# Patient Record
Sex: Male | Born: 1945 | Race: White | Hispanic: No | Marital: Married | Smoking: Former smoker
Health system: Southern US, Community
[De-identification: ages and names within clinical notes are randomized; demographics above are authoritative.]

## PROBLEM LIST (undated history)

## (undated) DIAGNOSIS — R2689 Other abnormalities of gait and mobility: Secondary | ICD-10-CM

## (undated) DIAGNOSIS — A6920 Lyme disease, unspecified: Secondary | ICD-10-CM

## (undated) DIAGNOSIS — F32A Depression, unspecified: Secondary | ICD-10-CM

## (undated) DIAGNOSIS — F329 Major depressive disorder, single episode, unspecified: Secondary | ICD-10-CM

## (undated) DIAGNOSIS — I10 Essential (primary) hypertension: Secondary | ICD-10-CM

## (undated) DIAGNOSIS — D649 Anemia, unspecified: Secondary | ICD-10-CM

## (undated) DIAGNOSIS — R251 Tremor, unspecified: Secondary | ICD-10-CM

## (undated) DIAGNOSIS — R0600 Dyspnea, unspecified: Secondary | ICD-10-CM

## (undated) DIAGNOSIS — G6181 Chronic inflammatory demyelinating polyneuritis: Secondary | ICD-10-CM

## (undated) DIAGNOSIS — M1990 Unspecified osteoarthritis, unspecified site: Secondary | ICD-10-CM

## (undated) DIAGNOSIS — G4730 Sleep apnea, unspecified: Secondary | ICD-10-CM

## (undated) DIAGNOSIS — E039 Hypothyroidism, unspecified: Secondary | ICD-10-CM

## (undated) DIAGNOSIS — K219 Gastro-esophageal reflux disease without esophagitis: Secondary | ICD-10-CM

## (undated) DIAGNOSIS — J8410 Pulmonary fibrosis, unspecified: Secondary | ICD-10-CM

## (undated) DIAGNOSIS — E119 Type 2 diabetes mellitus without complications: Secondary | ICD-10-CM

## (undated) HISTORY — PX: MUSCLE BIOPSY: SHX716

## (undated) HISTORY — PX: BACK SURGERY: SHX140

## (undated) HISTORY — PX: TOTAL KNEE ARTHROPLASTY: SHX125

## (undated) HISTORY — PX: OTHER SURGICAL HISTORY: SHX169

## (undated) HISTORY — PX: APPENDECTOMY: SHX54

## (undated) HISTORY — PX: CATARACT EXTRACTION W/ INTRAOCULAR LENS IMPLANT: SHX1309

## (undated) HISTORY — PX: HIP SURGERY: SHX245

## (undated) HISTORY — PX: COLONOSCOPY: SHX174

---

## 2009-03-10 ENCOUNTER — Inpatient Hospital Stay (HOSPITAL_COMMUNITY): Admission: RE | Admit: 2009-03-10 | Discharge: 2009-03-12 | Payer: Self-pay | Admitting: Orthopedic Surgery

## 2010-09-09 LAB — BASIC METABOLIC PANEL
BUN: 14 mg/dL (ref 6–23)
BUN: 7 mg/dL (ref 6–23)
Calcium: 8.8 mg/dL (ref 8.4–10.5)
Chloride: 101 mEq/L (ref 96–112)
Creatinine, Ser: 1.02 mg/dL (ref 0.4–1.5)
Creatinine, Ser: 1.14 mg/dL (ref 0.4–1.5)
GFR calc Af Amer: >60 mL/min (ref >60)
GFR calc non Af Amer: >60 mL/min (ref >60)
GFR calc non Af Amer: >60 mL/min (ref >60)
Glucose, Bld: 122 mg/dL — ABNORMAL HIGH (ref 70–99)

## 2010-09-09 LAB — CBC
HCT: 33.1 % — ABNORMAL LOW (ref 39.0–52.0)
MCV: 90 fL (ref 78.0–100.0)
Platelets: 150 10*3/uL (ref 150–400)
Platelets: 152 10*3/uL (ref 150–400)
RBC: 3.57 MIL/uL — ABNORMAL LOW (ref 4.22–5.81)
RDW: 13.1 % (ref 11.5–15.5)
WBC: 5.7 10*3/uL (ref 4.0–10.5)
WBC: 6.2 10*3/uL (ref 4.0–10.5)

## 2010-09-09 LAB — GLUCOSE, CAPILLARY
Glucose-Capillary: 120 mg/dL — ABNORMAL HIGH (ref 70–99)
Glucose-Capillary: 125 mg/dL — ABNORMAL HIGH (ref 70–99)
Glucose-Capillary: 126 mg/dL — ABNORMAL HIGH (ref 70–99)
Glucose-Capillary: 128 mg/dL — ABNORMAL HIGH (ref 70–99)
Glucose-Capillary: 131 mg/dL — ABNORMAL HIGH (ref 70–99)
Glucose-Capillary: 139 mg/dL — ABNORMAL HIGH (ref 70–99)

## 2010-09-09 LAB — TYPE AND SCREEN: ABO/RH(D): A POS

## 2010-09-09 LAB — ABO/RH: ABO/RH(D): A POS

## 2010-09-10 LAB — URINALYSIS, ROUTINE W REFLEX MICROSCOPIC
Bilirubin Urine: NEGATIVE
Glucose, UA: NEGATIVE mg/dL
Hgb urine dipstick: NEGATIVE
Specific Gravity, Urine: 1.014 (ref 1.005–1.030)

## 2010-09-10 LAB — APTT: aPTT: 38 seconds — ABNORMAL HIGH (ref 24–37)

## 2010-09-10 LAB — DIFFERENTIAL
Basophils Absolute: 0 10*3/uL (ref 0.0–0.1)
Basophils Relative: 0 % (ref 0–1)
Lymphocytes Relative: 18 % (ref 12–46)
Monocytes Absolute: 0.7 10*3/uL (ref 0.1–1.0)
Monocytes Relative: 10 % (ref 3–12)
Neutro Abs: 5.4 10*3/uL (ref 1.7–7.7)
Neutrophils Relative %: 69 % (ref 43–77)

## 2010-09-10 LAB — COMPREHENSIVE METABOLIC PANEL
AST: 29 U/L (ref 0–37)
Albumin: 4 g/dL (ref 3.5–5.2)
BUN: 15 mg/dL (ref 6–23)
Creatinine, Ser: 1.03 mg/dL (ref 0.4–1.5)
GFR calc Af Amer: >60 mL/min (ref >60)
Potassium: 3.8 mEq/L (ref 3.5–5.1)
Total Protein: 7.4 g/dL (ref 6.0–8.3)

## 2010-09-10 LAB — PROTIME-INR: INR: 1 (ref 0.00–1.49)

## 2010-09-10 LAB — CBC
Hemoglobin: 14.9 g/dL (ref 13.0–17.0)
MCHC: 34.3 g/dL (ref 30.0–36.0)
RBC: 4.79 MIL/uL (ref 4.22–5.81)

## 2016-02-25 NOTE — H&P (Signed)
  Louis Simpson is an 70 y.o. male.    Chief Complaint: left shoulder pain  HPI: Pt is a 70 y.o. male complaining of left shoulder pain for multiple years. Pain had continually increased since the beginning. X-rays in the clinic show end-stage arthritic changes of the left shoulder. Pt has tried various conservative treatments which have failed to alleviate their symptoms, including injections and therapy. Various options are discussed with the patient. Risks, benefits and expectations were discussed with the patient. Patient understand the risks, benefits and expectations and wishes to proceed with surgery.   PCP:  No primary care provider on file.  D/C Plans: Home  PMH: No past medical history on file.  PSH: No past surgical history on file.  Social History:  has no tobacco, alcohol, and drug history on file.  Allergies:  Allergies not on file  Medications: No current facility-administered medications for this encounter.    No current outpatient prescriptions on file.    No results found for this or any previous visit (from the past 48 hour(s)). No results found.  ROS: Pain with rom of the left upper extremity  Physical Exam:  Alert and oriented 70 y.o. male in no acute distress Cranial nerves 2-12 intact Cervical spine: full rom with no tenderness, nv intact distally Chest: active breath sounds bilaterally, no wheeze rhonchi or rales Heart: regular rate and rhythm, no murmur Abd: non tender non distended with active bowel sounds Hip is stable with rom  Left shoulder with mild crepitus with rom nv intact distally No rashes Strength 4.5/5 with ER and IR but cuff intact   Assessment/Plan Assessment: left shoulder end stage osteoarthritis  Plan: Patient will undergo a left total shoulder arthroplasty by [facility]. Risks benefits and expectations were discussed with the patient. Patient understand risks, benefits and expectations and wishes to  proceed.

## 2016-02-26 NOTE — Pre-Procedure Instructions (Signed)
Louis Simpson  02/26/2016     No Pharmacies Listed   Your procedure is scheduled on Friday September 29.  Report to [facility] Admitting at 5:30 A.M.  Call this number if you have problems the morning of surgery:  [phone]   Remember:  Do not eat food or drink liquids after midnight.  Take these medicines the morning of surgery with A SIP OF WATER: bupropion (Wellbutrin), Flexeril, duloxetine (Cymbalta), levothyroxine (Synthroid), tramadol (ultram) if needed  7 days prior to surgery STOP taking any Aspirin, Aleve, Naproxen, Ibuprofen, Motrin, Advil, Goody's, BC's, all herbal medications, fish oil, and all vitamins   WHAT DO I DO ABOUT MY DIABETES MEDICATION?   Marland Kitchen Do not take oral diabetes medicines (pills) the morning of surgery. DO NOT TAKE glimepiride (Amaryl) the day of surgery.       How to Manage Your Diabetes Before and After Surgery  Why is it important to control my blood sugar before and after surgery? . Improving blood sugar levels before and after surgery helps healing and can limit problems. . A way of improving blood sugar control is eating a healthy diet by: o  Eating less sugar and carbohydrates o  Increasing activity/exercise o  Talking with your doctor about reaching your blood sugar goals . High blood sugars (greater than 180 mg/dL) can raise your risk of infections and slow your recovery, so you will need to focus on controlling your diabetes during the weeks before surgery. . Make sure that the doctor who takes care of your diabetes knows about your planned surgery including the date and location.  How do I manage my blood sugar before surgery? . Check your blood sugar at least 4 times a day, starting 2 days before surgery, to make sure that the level is not too high or low. o Check your blood sugar the morning of your surgery when you wake up and every 2 hours until you get to the Short Stay unit. . If your blood sugar is less than  70 mg/dL, you will need to treat for low blood sugar: o Do not take insulin. o Treat a low blood sugar (less than 70 mg/dL) with  cup of clear juice (cranberry or apple), 4 glucose tablets, OR glucose gel. o Recheck blood sugar in 15 minutes after treatment (to make sure it is greater than 70 mg/dL). If your blood sugar is not greater than 70 mg/dL on recheck, call [phone] for further instructions. . Report your blood sugar to the short stay nurse when you get to Short Stay.  . If you are admitted to the hospital after surgery: o Your blood sugar will be checked by the staff and you will probably be given insulin after surgery (instead of oral diabetes medicines) to make sure you have good blood sugar levels. o The goal for blood sugar control after surgery is 80-180 mg/dL.                Do not wear jewelry, make-up or nail polish.  Do not wear lotions, powders, or perfumes, or deoderant.  Do not shave 48 hours prior to surgery.  Men may shave face and neck.  Do not bring valuables to the hospital.  [facility] is not responsible for any belongings or valuables.  Contacts, dentures or bridgework may not be worn into surgery.  Leave your suitcase in the car.  After surgery it may be brought to your room.  For patients  admitted to the hospital, discharge time will be determined by your treatment team.  Patients discharged the day of surgery will not be allowed to drive home.   Special instructions:    [facility]- Preparing For Surgery  Before surgery, you can play an important role. Because skin is not sterile, your skin needs to be as free of germs as possible. You can reduce the number of germs on your skin by washing with CHG (chlorahexidine gluconate) Soap before surgery.  CHG is an antiseptic cleaner which kills germs and bonds with the skin to continue killing germs even after washing.  Please do not use if you have an allergy to CHG or antibacterial soaps. If your  skin becomes reddened/irritated stop using the CHG.  Do not shave (including legs and underarms) for at least 48 hours prior to first CHG shower. It is OK to shave your face.  Please follow these instructions carefully.   1. Shower the NIGHT BEFORE SURGERY and the MORNING OF SURGERY with CHG.   2. If you chose to wash your hair, wash your hair first as usual with your normal shampoo.  3. After you shampoo, rinse your hair and body thoroughly to remove the shampoo.  4. Use CHG as you would any other liquid soap. You can apply CHG directly to the skin and wash gently with a scrungie or a clean washcloth.   5. Apply the CHG Soap to your body ONLY FROM THE NECK DOWN.  Do not use on open wounds or open sores. Avoid contact with your eyes, ears, mouth and genitals (private parts). Wash genitals (private parts) with your normal soap.  6. Wash thoroughly, paying special attention to the area where your surgery will be performed.  7. Thoroughly rinse your body with warm water from the neck down.  8. DO NOT shower/wash with your normal soap after using and rinsing off the CHG Soap.  9. Pat yourself dry with a CLEAN TOWEL.   10. Wear CLEAN PAJAMAS   11. Place CLEAN SHEETS on your bed the night of your first shower and DO NOT SLEEP WITH PETS.    Day of Surgery: Do not apply any deodorants/lotions. Please wear clean clothes to the hospital/surgery center.      Please read over the following fact sheets that you were given. MRSA Information

## 2016-02-29 ENCOUNTER — Encounter (HOSPITAL_COMMUNITY): Payer: Self-pay

## 2016-02-29 ENCOUNTER — Encounter (HOSPITAL_COMMUNITY)
Admission: RE | Admit: 2016-02-29 | Discharge: 2016-02-29 | Disposition: A | Discharge status: Home / Self Care | Payer: Medicare Other | Source: Ambulatory Visit | Attending: Orthopedic Surgery | Admitting: Orthopedic Surgery

## 2016-02-29 DIAGNOSIS — E119 Type 2 diabetes mellitus without complications: Secondary | ICD-10-CM | POA: Diagnosis not present

## 2016-02-29 DIAGNOSIS — Z01818 Encounter for other preprocedural examination: Secondary | ICD-10-CM | POA: Insufficient documentation

## 2016-02-29 HISTORY — DX: Other abnormalities of gait and mobility: R26.89

## 2016-02-29 HISTORY — DX: Depression, unspecified: F32.A

## 2016-02-29 HISTORY — DX: Hypothyroidism, unspecified: E03.9

## 2016-02-29 HISTORY — DX: Type 2 diabetes mellitus without complications: E11.9

## 2016-02-29 HISTORY — DX: Major depressive disorder, single episode, unspecified: F32.9

## 2016-02-29 HISTORY — DX: Chronic inflammatory demyelinating polyneuritis: G61.81

## 2016-02-29 HISTORY — DX: Essential (primary) hypertension: I10

## 2016-02-29 HISTORY — DX: Tremor, unspecified: R25.1

## 2016-02-29 HISTORY — DX: Sleep apnea, unspecified: G47.30

## 2016-02-29 HISTORY — DX: Gastro-esophageal reflux disease without esophagitis: K21.9

## 2016-02-29 HISTORY — DX: Lyme disease, unspecified: A69.20

## 2016-02-29 HISTORY — DX: Unspecified osteoarthritis, unspecified site: M19.90

## 2016-02-29 LAB — CBC
HCT: 41.5 % (ref 39.0–52.0)
HEMOGLOBIN: 14.3 g/dL (ref 13.0–17.0)
MCH: 31.2 pg (ref 26.0–34.0)
MCHC: 34.5 g/dL (ref 30.0–36.0)
MCV: 90.4 fL (ref 78.0–100.0)
Platelets: 190 10*3/uL (ref 150–400)
RBC: 4.59 MIL/uL (ref 4.22–5.81)
RDW: 13 % (ref 11.5–15.5)
WBC: 6.3 10*3/uL (ref 4.0–10.5)

## 2016-02-29 LAB — BASIC METABOLIC PANEL
Anion gap: 12 (ref 5–15)
BUN: 21 mg/dL — ABNORMAL HIGH (ref 6–20)
CHLORIDE: 99 mmol/L — AB (ref 101–111)
CO2: 25 mmol/L (ref 22–32)
Calcium: 10 mg/dL (ref 8.9–10.3)
Creatinine, Ser: 1.55 mg/dL — ABNORMAL HIGH (ref 0.61–1.24)
GFR calc non Af Amer: 44 mL/min — ABNORMAL LOW (ref >60)
GFR, EST AFRICAN AMERICAN: 51 mL/min — AB (ref >60)
Glucose, Bld: 130 mg/dL — ABNORMAL HIGH (ref 65–99)
POTASSIUM: 4.5 mmol/L (ref 3.5–5.1)
SODIUM: 136 mmol/L (ref 135–145)

## 2016-02-29 LAB — SURGICAL PCR SCREEN
MRSA, PCR: NEGATIVE
STAPHYLOCOCCUS AUREUS: NEGATIVE

## 2016-02-29 LAB — HEMOGLOBIN A1C
HEMOGLOBIN A1C: 5.9 % — AB (ref 4.8–5.6)
MEAN PLASMA GLUCOSE: 123 mg/dL

## 2016-02-29 LAB — GLUCOSE, CAPILLARY: GLUCOSE-CAPILLARY: 138 mg/dL — AB (ref 65–99)

## 2016-02-29 NOTE — Progress Notes (Signed)
PCP: Dr. Shary DecampGrisso Cardiologist: [facility], EKG, stress test, ECHO and last office visit requested. Pt states all of these things were done in the past month for cardiac clearance due to an abnormal EKG.   No complaints of chest pain, SOB, signs of infection at this time.   Pt states he checks his CBG once a week, fasting is in 120s. Last A1c a few months ago was 5.6 per pt, redraw today.

## 2016-03-01 NOTE — Progress Notes (Signed)
Anesthesia Chart Review:  Pt is a 70 year old male scheduled for L total shoulder arthroplasty on 03/04/2016 with Beverely LowSteve Norris, MD.   - PCP is Feliciana RossettiGreg Grisso, MD (notes in care everywhere) - Saw cardiologist Ginger CarneJames McGukin, MD for pre-op eval and was cleared for surgery at average risk (notes in care everywhere)  PMH includes:  HTN, DM, OSA, hypothyroidism, chronic inflammatory demyelinating polyneuropathy , GERD. Former smoker. BMI 33  Medications include: ASA, glimepiride, levothyroxine, lisinopril  Preoperative labs reviewed.   - HgbA1c 5.9, glucose 130 - Cr 1.55, BUN 21. Consistent with prior results over last 1-2 years (see care everywhere)  EKG 11/10/15: Sinus rhythm with occasional PVCs. Left axis deviation  Nuclear stress test 12/16/15 [facility]): - Mostly fixed inferior wall defect suggestive of prior MI vs diaphragmatic attenuation. There is possible posterior ischemia but this is confounded by  Some adjacent bowel uptake. Low normal EF 50%  Echo 11/23/15 [facility]): 1. Technically limited and difficult study but adequate enough for assessment 2. Normal EF 65% 3. No significant valvular disease   If no changes, I anticipate pt can proceed with surgery as scheduled.   Rica Mastngela Malvika Tung, FNP-BC [facility]/Anesthesiology Phone: [phone] 03/01/2016 2:42 PM

## 2016-03-03 MED ORDER — CEFAZOLIN SODIUM 10 G IJ SOLR
3.0000 g | INTRAMUSCULAR | Status: AC
  Administered 2016-03-04: 3 g via INTRAVENOUS
  Filled 2016-03-03: qty 3000

## 2016-03-04 ENCOUNTER — Encounter (HOSPITAL_COMMUNITY)
Admission: RE | Disposition: A | Discharge status: Home Health | Payer: Self-pay | Source: Ambulatory Visit | Attending: Orthopedic Surgery

## 2016-03-04 ENCOUNTER — Inpatient Hospital Stay (HOSPITAL_COMMUNITY): Payer: Medicare Other

## 2016-03-04 ENCOUNTER — Inpatient Hospital Stay (HOSPITAL_COMMUNITY)
Admission: RE | Admit: 2016-03-04 | Discharge: 2016-03-05 | DRG: 483 | Disposition: A | Discharge status: Home Health | Payer: Medicare Other | Source: Ambulatory Visit | Attending: Orthopedic Surgery | Admitting: Orthopedic Surgery

## 2016-03-04 ENCOUNTER — Inpatient Hospital Stay (HOSPITAL_COMMUNITY): Payer: Medicare Other | Admitting: Certified Registered Nurse Anesthetist

## 2016-03-04 ENCOUNTER — Inpatient Hospital Stay (HOSPITAL_COMMUNITY): Payer: Medicare Other | Admitting: Emergency Medicine

## 2016-03-04 DIAGNOSIS — I10 Essential (primary) hypertension: Secondary | ICD-10-CM | POA: Diagnosis present

## 2016-03-04 DIAGNOSIS — Z87891 Personal history of nicotine dependence: Secondary | ICD-10-CM | POA: Diagnosis not present

## 2016-03-04 DIAGNOSIS — M19012 Primary osteoarthritis, left shoulder: Principal | ICD-10-CM | POA: Diagnosis present

## 2016-03-04 DIAGNOSIS — K219 Gastro-esophageal reflux disease without esophagitis: Secondary | ICD-10-CM | POA: Diagnosis present

## 2016-03-04 DIAGNOSIS — Z96612 Presence of left artificial shoulder joint: Secondary | ICD-10-CM

## 2016-03-04 DIAGNOSIS — Z96619 Presence of unspecified artificial shoulder joint: Secondary | ICD-10-CM

## 2016-03-04 DIAGNOSIS — M25512 Pain in left shoulder: Secondary | ICD-10-CM | POA: Diagnosis present

## 2016-03-04 HISTORY — PX: TOTAL SHOULDER ARTHROPLASTY: SHX126

## 2016-03-04 LAB — GLUCOSE, CAPILLARY
GLUCOSE-CAPILLARY: 126 mg/dL — AB (ref 65–99)
GLUCOSE-CAPILLARY: 106 mg/dL — AB (ref 65–99)
Glucose-Capillary: 154 mg/dL — ABNORMAL HIGH (ref 65–99)

## 2016-03-04 SURGERY — ARTHROPLASTY, SHOULDER, TOTAL
Anesthesia: General | Site: Shoulder | Laterality: Left

## 2016-03-04 MED ORDER — OXYCODONE HCL 5 MG PO TABS
5.0000 mg | ORAL_TABLET | ORAL | Status: DC | PRN
  Administered 2016-03-04 – 2016-03-05 (×3): 10 mg via ORAL
  Filled 2016-03-04 (×3): qty 2

## 2016-03-04 MED ORDER — CLONAZEPAM 1 MG PO TABS
2.0000 mg | ORAL_TABLET | Freq: Every day | ORAL | Status: DC
  Administered 2016-03-04: 2 mg via ORAL
  Filled 2016-03-04: qty 2

## 2016-03-04 MED ORDER — CEFAZOLIN SODIUM-DEXTROSE 2-4 GM/100ML-% IV SOLN
2.0000 g | Freq: Four times a day (QID) | INTRAVENOUS | Status: AC
  Administered 2016-03-04 – 2016-03-05 (×3): 2 g via INTRAVENOUS
  Filled 2016-03-04 (×3): qty 100

## 2016-03-04 MED ORDER — LIDOCAINE 2% (20 MG/ML) 5 ML SYRINGE
INTRAMUSCULAR | Status: AC
  Filled 2016-03-04: qty 10

## 2016-03-04 MED ORDER — PROPOFOL 10 MG/ML IV BOLUS
INTRAVENOUS | Status: AC
  Filled 2016-03-04: qty 40

## 2016-03-04 MED ORDER — ONDANSETRON HCL 4 MG PO TABS: 4.0000 mg | ORAL_TABLET | Freq: Four times a day (QID) | ORAL | Status: DC | PRN

## 2016-03-04 MED ORDER — PHENOL 1.4 % MT LIQD
1.0000 | OROMUCOSAL | Status: DC | PRN
Start: 2016-03-04 — End: 2016-03-05

## 2016-03-04 MED ORDER — ONDANSETRON HCL 4 MG/2ML IJ SOLN
INTRAMUSCULAR | Status: AC
  Filled 2016-03-04: qty 2

## 2016-03-04 MED ORDER — BUPROPION HCL ER (XL) 150 MG PO TB24
150.0000 mg | ORAL_TABLET | Freq: Every day | ORAL | Status: DC
  Administered 2016-03-05: 150 mg via ORAL
  Filled 2016-03-04: qty 1

## 2016-03-04 MED ORDER — FENTANYL CITRATE (PF) 100 MCG/2ML IJ SOLN
INTRAMUSCULAR | Status: DC | PRN
  Administered 2016-03-04 (×2): 25 ug via INTRAVENOUS

## 2016-03-04 MED ORDER — BUPIVACAINE-EPINEPHRINE 0.25% -1:200000 IJ SOLN
INTRAMUSCULAR | Status: DC | PRN
  Administered 2016-03-04: 9 mL

## 2016-03-04 MED ORDER — ROCURONIUM BROMIDE 10 MG/ML (PF) SYRINGE
PREFILLED_SYRINGE | INTRAVENOUS | Status: AC
  Filled 2016-03-04: qty 10

## 2016-03-04 MED ORDER — THROMBIN 5000 UNITS EX SOLR
CUTANEOUS | Status: AC
  Filled 2016-03-04: qty 5000

## 2016-03-04 MED ORDER — LACTATED RINGERS IV SOLN
INTRAVENOUS | Status: DC | PRN
  Administered 2016-03-04 (×2): via INTRAVENOUS

## 2016-03-04 MED ORDER — ROCURONIUM BROMIDE 100 MG/10ML IV SOLN
INTRAVENOUS | Status: DC | PRN
  Administered 2016-03-04: 50 mg via INTRAVENOUS

## 2016-03-04 MED ORDER — ACETAMINOPHEN 650 MG RE SUPP: 650.0000 mg | Freq: Four times a day (QID) | RECTAL | Status: DC | PRN

## 2016-03-04 MED ORDER — SODIUM CHLORIDE 0.9 % IV SOLN
INTRAVENOUS | Status: DC
  Administered 2016-03-04: 14:00:00 via INTRAVENOUS

## 2016-03-04 MED ORDER — PROMETHAZINE HCL 25 MG/ML IJ SOLN: 6.2500 mg | INTRAMUSCULAR | Status: DC | PRN

## 2016-03-04 MED ORDER — PROPOFOL 10 MG/ML IV BOLUS
INTRAVENOUS | Status: DC | PRN
  Administered 2016-03-04: 50 mg via INTRAVENOUS
  Administered 2016-03-04: 200 mg via INTRAVENOUS

## 2016-03-04 MED ORDER — NAPROXEN 250 MG PO TABS
500.0000 mg | ORAL_TABLET | Freq: Two times a day (BID) | ORAL | Status: DC
  Administered 2016-03-04 – 2016-03-05 (×2): 500 mg via ORAL
  Filled 2016-03-04 (×2): qty 2

## 2016-03-04 MED ORDER — PHENYLEPHRINE HCL 10 MG/ML IJ SOLN
INTRAMUSCULAR | Status: DC | PRN
  Administered 2016-03-04: 120 ug via INTRAVENOUS

## 2016-03-04 MED ORDER — EPHEDRINE SULFATE 50 MG/ML IJ SOLN
INTRAMUSCULAR | Status: DC | PRN
  Administered 2016-03-04 (×2): 10 mg via INTRAVENOUS

## 2016-03-04 MED ORDER — DIPHENHYDRAMINE HCL 50 MG/ML IJ SOLN
INTRAMUSCULAR | Status: AC
  Filled 2016-03-04: qty 1

## 2016-03-04 MED ORDER — PHENYLEPHRINE HCL 10 MG/ML IJ SOLN
INTRAVENOUS | Status: DC | PRN
  Administered 2016-03-04: 09:00:00 via INTRAVENOUS
  Administered 2016-03-04: 40 ug/min via INTRAVENOUS

## 2016-03-04 MED ORDER — CHLORHEXIDINE GLUCONATE 4 % EX LIQD: 60.0000 mL | Freq: Once | CUTANEOUS | Status: DC

## 2016-03-04 MED ORDER — ACETAMINOPHEN 325 MG PO TABS: 650.0000 mg | ORAL_TABLET | Freq: Four times a day (QID) | ORAL | Status: DC | PRN

## 2016-03-04 MED ORDER — LIDOCAINE 2% (20 MG/ML) 5 ML SYRINGE
INTRAMUSCULAR | Status: AC
  Filled 2016-03-04: qty 5

## 2016-03-04 MED ORDER — POLYETHYLENE GLYCOL 3350 17 G PO PACK
17.0000 g | PACK | Freq: Every day | ORAL | Status: DC | PRN
  Filled 2016-03-04: qty 1

## 2016-03-04 MED ORDER — METOCLOPRAMIDE HCL 5 MG PO TABS: 5.0000 mg | ORAL_TABLET | Freq: Three times a day (TID) | ORAL | Status: DC | PRN

## 2016-03-04 MED ORDER — ASPIRIN EC 81 MG PO TBEC
81.0000 mg | DELAYED_RELEASE_TABLET | Freq: Every day | ORAL | Status: DC
  Administered 2016-03-05: 81 mg via ORAL
  Filled 2016-03-04: qty 1

## 2016-03-04 MED ORDER — MENTHOL 3 MG MT LOZG
1.0000 | LOZENGE | OROMUCOSAL | Status: DC | PRN
  Administered 2016-03-05: 3 mg via ORAL
  Filled 2016-03-04: qty 9

## 2016-03-04 MED ORDER — DIPHENHYDRAMINE HCL 50 MG/ML IJ SOLN
INTRAMUSCULAR | Status: DC | PRN
  Administered 2016-03-04: 25 mg via INTRAVENOUS

## 2016-03-04 MED ORDER — LIDOCAINE HCL (CARDIAC) 20 MG/ML IV SOLN
INTRAVENOUS | Status: DC | PRN
  Administered 2016-03-04: 60 mg via INTRATRACHEAL
  Administered 2016-03-04: 100 mg via INTRAVENOUS

## 2016-03-04 MED ORDER — HYDROMORPHONE HCL 1 MG/ML IJ SOLN: 0.2500 mg | INTRAMUSCULAR | Status: DC | PRN

## 2016-03-04 MED ORDER — GLIMEPIRIDE 1 MG PO TABS
1.0000 mg | ORAL_TABLET | Freq: Every day | ORAL | Status: DC
  Filled 2016-03-04 (×2): qty 1

## 2016-03-04 MED ORDER — DULOXETINE HCL 60 MG PO CPEP
60.0000 mg | ORAL_CAPSULE | ORAL | Status: DC
  Administered 2016-03-05: 60 mg via ORAL
  Filled 2016-03-04: qty 1

## 2016-03-04 MED ORDER — TRAMADOL HCL 50 MG PO TABS
100.0000 mg | ORAL_TABLET | Freq: Every day | ORAL | Status: DC
  Administered 2016-03-05: 100 mg via ORAL
  Filled 2016-03-04: qty 2

## 2016-03-04 MED ORDER — THROMBIN 5000 UNITS EX SOLR
CUTANEOUS | Status: DC | PRN
  Administered 2016-03-04: 5000 [IU] via TOPICAL

## 2016-03-04 MED ORDER — ALBUMIN HUMAN 5 % IV SOLN
INTRAVENOUS | Status: DC | PRN
  Administered 2016-03-04: 08:00:00 via INTRAVENOUS

## 2016-03-04 MED ORDER — FENTANYL CITRATE (PF) 100 MCG/2ML IJ SOLN
INTRAMUSCULAR | Status: AC
  Filled 2016-03-04: qty 4

## 2016-03-04 MED ORDER — BISACODYL 10 MG RE SUPP: 10.0000 mg | Freq: Every day | RECTAL | Status: DC | PRN

## 2016-03-04 MED ORDER — CYCLOBENZAPRINE HCL 10 MG PO TABS
10.0000 mg | ORAL_TABLET | Freq: Two times a day (BID) | ORAL | Status: DC
  Administered 2016-03-04 – 2016-03-05 (×2): 10 mg via ORAL
  Filled 2016-03-04 (×2): qty 1

## 2016-03-04 MED ORDER — BUPIVACAINE-EPINEPHRINE (PF) 0.5% -1:200000 IJ SOLN
INTRAMUSCULAR | Status: DC | PRN
  Administered 2016-03-04: 30 mL via PERINEURAL

## 2016-03-04 MED ORDER — LEVOTHYROXINE SODIUM 25 MCG PO TABS
125.0000 ug | ORAL_TABLET | Freq: Every day | ORAL | Status: DC
  Administered 2016-03-05: 125 ug via ORAL
  Filled 2016-03-04: qty 1

## 2016-03-04 MED ORDER — ONDANSETRON HCL 4 MG/2ML IJ SOLN: 4.0000 mg | Freq: Four times a day (QID) | INTRAMUSCULAR | Status: DC | PRN

## 2016-03-04 MED ORDER — ONDANSETRON HCL 4 MG/2ML IJ SOLN
INTRAMUSCULAR | Status: DC | PRN
  Administered 2016-03-04: 4 mg via INTRAVENOUS

## 2016-03-04 MED ORDER — PHENYLEPHRINE 40 MCG/ML (10ML) SYRINGE FOR IV PUSH (FOR BLOOD PRESSURE SUPPORT)
PREFILLED_SYRINGE | INTRAVENOUS | Status: AC
  Filled 2016-03-04: qty 10

## 2016-03-04 MED ORDER — OXYCODONE-ACETAMINOPHEN 5-325 MG PO TABS: 1.0000 | ORAL_TABLET | ORAL | 0 refills | Status: DC | PRN

## 2016-03-04 MED ORDER — SUCCINYLCHOLINE CHLORIDE 200 MG/10ML IV SOSY
PREFILLED_SYRINGE | INTRAVENOUS | Status: AC
  Filled 2016-03-04: qty 10

## 2016-03-04 MED ORDER — 0.9 % SODIUM CHLORIDE (POUR BTL) OPTIME
TOPICAL | Status: DC | PRN
  Administered 2016-03-04: 1000 mL

## 2016-03-04 MED ORDER — METOCLOPRAMIDE HCL 5 MG/ML IJ SOLN: 5.0000 mg | Freq: Three times a day (TID) | INTRAMUSCULAR | Status: DC | PRN

## 2016-03-04 MED ORDER — MIDAZOLAM HCL 5 MG/5ML IJ SOLN
INTRAMUSCULAR | Status: DC | PRN
  Administered 2016-03-04: 2 mg via INTRAVENOUS

## 2016-03-04 MED ORDER — SUGAMMADEX SODIUM 500 MG/5ML IV SOLN
INTRAVENOUS | Status: AC
  Filled 2016-03-04: qty 5

## 2016-03-04 MED ORDER — LISINOPRIL 10 MG PO TABS
10.0000 mg | ORAL_TABLET | Freq: Every day | ORAL | Status: DC
  Administered 2016-03-05: 10 mg via ORAL
  Filled 2016-03-04: qty 1

## 2016-03-04 MED ORDER — PHENYLEPHRINE HCL 10 MG/ML IJ SOLN
INTRAMUSCULAR | Status: AC
  Filled 2016-03-04: qty 1

## 2016-03-04 MED ORDER — DULOXETINE HCL 30 MG PO CPEP
30.0000 mg | ORAL_CAPSULE | Freq: Every day | ORAL | Status: DC
  Administered 2016-03-04: 30 mg via ORAL
  Filled 2016-03-04: qty 1

## 2016-03-04 MED ORDER — SUCCINYLCHOLINE 20MG/ML (10ML) SYRINGE FOR MEDFUSION PUMP - OPTIME
INTRAMUSCULAR | Status: DC | PRN
  Administered 2016-03-04: 120 mg via INTRAVENOUS

## 2016-03-04 MED ORDER — EPHEDRINE 5 MG/ML INJ
INTRAVENOUS | Status: AC
  Filled 2016-03-04: qty 10

## 2016-03-04 MED ORDER — BUPIVACAINE-EPINEPHRINE (PF) 0.25% -1:200000 IJ SOLN
INTRAMUSCULAR | Status: AC
  Filled 2016-03-04: qty 30

## 2016-03-04 MED ORDER — NIACIN 500 MG PO TABS
500.0000 mg | ORAL_TABLET | Freq: Two times a day (BID) | ORAL | Status: DC
  Administered 2016-03-04 – 2016-03-05 (×2): 500 mg via ORAL
  Filled 2016-03-04 (×3): qty 1

## 2016-03-04 MED ORDER — GLYCOPYRROLATE 0.2 MG/ML IJ SOLN
INTRAMUSCULAR | Status: DC | PRN
  Administered 2016-03-04: 0.2 mg via INTRAVENOUS
  Administered 2016-03-04: 0.1 mg via INTRAVENOUS

## 2016-03-04 MED ORDER — MIDAZOLAM HCL 2 MG/2ML IJ SOLN
INTRAMUSCULAR | Status: AC
  Filled 2016-03-04: qty 2

## 2016-03-04 MED ORDER — SUGAMMADEX SODIUM 500 MG/5ML IV SOLN
INTRAVENOUS | Status: DC | PRN
  Administered 2016-03-04: 250 mg via INTRAVENOUS

## 2016-03-04 MED ORDER — HYDROMORPHONE HCL 1 MG/ML IJ SOLN
0.5000 mg | INTRAMUSCULAR | Status: DC | PRN
  Administered 2016-03-05: 1 mg via INTRAVENOUS
  Filled 2016-03-04: qty 1

## 2016-03-04 SURGICAL SUPPLY — 79 items
BLADE SAW SAG 73X25 THK (BLADE) ×2
BLADE SAW SGTL 73X25 THK (BLADE) ×1 IMPLANT
BUR SURG 4X8 MED (BURR) IMPLANT
BURR SURG 4MMX8MM MEDIUM (BURR)
BURR SURG 4X8 MED (BURR)
CAPT SHLDR TOTAL 2 ×3 IMPLANT
CEMENT BONE DEPUY (Cement) ×3 IMPLANT
CLOSURE WOUND 1/2 X4 (GAUZE/BANDAGES/DRESSINGS) ×1
COVER SURGICAL LIGHT HANDLE (MISCELLANEOUS) ×3 IMPLANT
DRAPE IMP U-DRAPE 54X76 (DRAPES) IMPLANT
DRAPE INCISE IOBAN 66X45 STRL (DRAPES) ×3 IMPLANT
DRAPE ORTHO SPLIT 77X108 STRL (DRAPES) ×4
DRAPE SURG ORHT 6 SPLT 77X108 (DRAPES) ×2 IMPLANT
DRAPE U-SHAPE 47X51 STRL (DRAPES) ×3 IMPLANT
DRAPE X-RAY CASS 24X20 (DRAPES) IMPLANT
DRILL BIT 5/64 (BIT) IMPLANT
DRSG ADAPTIC 3X8 NADH LF (GAUZE/BANDAGES/DRESSINGS) ×3 IMPLANT
DRSG PAD ABDOMINAL 8X10 ST (GAUZE/BANDAGES/DRESSINGS) ×3 IMPLANT
DURAPREP 26ML APPLICATOR (WOUND CARE) ×3 IMPLANT
ELECT BLADE 4.0 EZ CLEAN MEGAD (MISCELLANEOUS) ×3
ELECT NEEDLE TIP 2.8 STRL (NEEDLE) ×3 IMPLANT
ELECT REM PT RETURN 9FT ADLT (ELECTROSURGICAL) ×3
ELECTRODE BLDE 4.0 EZ CLN MEGD (MISCELLANEOUS) ×1 IMPLANT
ELECTRODE REM PT RTRN 9FT ADLT (ELECTROSURGICAL) ×1 IMPLANT
GAUZE SPONGE 4X4 12PLY STRL (GAUZE/BANDAGES/DRESSINGS) ×3 IMPLANT
GLOVE BIOGEL PI ORTHO PRO 7.5 (GLOVE) ×2
GLOVE BIOGEL PI ORTHO PRO SZ7 (GLOVE) ×2
GLOVE BIOGEL PI ORTHO PRO SZ8 (GLOVE) ×2
GLOVE ORTHO TXT STRL SZ7.5 (GLOVE) ×3 IMPLANT
GLOVE PI ORTHO PRO STRL 7.5 (GLOVE) ×1 IMPLANT
GLOVE PI ORTHO PRO STRL SZ7 (GLOVE) ×1 IMPLANT
GLOVE PI ORTHO PRO STRL SZ8 (GLOVE) ×1 IMPLANT
GLOVE SURG ORTHO 8.5 STRL (GLOVE) ×6 IMPLANT
GOWN STRL REUS W/ TWL XL LVL3 (GOWN DISPOSABLE) ×3 IMPLANT
GOWN STRL REUS W/TWL XL LVL3 (GOWN DISPOSABLE) ×6
HANDPIECE INTERPULSE COAX TIP (DISPOSABLE)
KIT BASIN OR (CUSTOM PROCEDURE TRAY) ×3 IMPLANT
KIT ROOM TURNOVER OR (KITS) ×3 IMPLANT
MANIFOLD NEPTUNE II (INSTRUMENTS) ×3 IMPLANT
NDL SUT 6 .5 CRC .975X.05 MAYO (NEEDLE) ×1 IMPLANT
NEEDLE 1/2 CIR MAYO (NEEDLE) IMPLANT
NEEDLE HYPO 25GX1X1/2 BEV (NEEDLE) ×3 IMPLANT
NEEDLE MAYO TAPER (NEEDLE) ×2
NS IRRIG 1000ML POUR BTL (IV SOLUTION) ×3 IMPLANT
PACK SHOULDER (CUSTOM PROCEDURE TRAY) ×3 IMPLANT
PACK UNIVERSAL I (CUSTOM PROCEDURE TRAY) IMPLANT
PAD ARMBOARD 7.5X6 YLW CONV (MISCELLANEOUS) ×6 IMPLANT
PIN METAGLENE 2.5 (PIN) ×6 IMPLANT
SET HNDPC FAN SPRY TIP SCT (DISPOSABLE) IMPLANT
SLING ARM IMMOBILIZER LRG (SOFTGOODS) IMPLANT
SLING ARM IMMOBILIZER MED (SOFTGOODS) IMPLANT
SMARTMIX MINI TOWER (MISCELLANEOUS) ×3
SPONGE LAP 18X18 X RAY DECT (DISPOSABLE) ×3 IMPLANT
SPONGE LAP 4X18 X RAY DECT (DISPOSABLE) IMPLANT
SPONGE SURGIFOAM ABS GEL SZ50 (HEMOSTASIS) ×3 IMPLANT
STRIP CLOSURE SKIN 1/2X4 (GAUZE/BANDAGES/DRESSINGS) ×2 IMPLANT
SUCTION FRAZIER HANDLE 10FR (MISCELLANEOUS) ×2
SUCTION TUBE FRAZIER 10FR DISP (MISCELLANEOUS) ×1 IMPLANT
SUT BONE WAX W31G (SUTURE) ×3 IMPLANT
SUT FIBERWIRE #2 38 T-5 BLUE (SUTURE) ×6
SUT MNCRL AB 4-0 PS2 18 (SUTURE) ×3 IMPLANT
SUT VIC AB 0 CT1 27 (SUTURE) ×4
SUT VIC AB 0 CT1 27XBRD ANBCTR (SUTURE) ×2 IMPLANT
SUT VIC AB 0 CT2 27 (SUTURE) ×3 IMPLANT
SUT VIC AB 1 CT1 27 (SUTURE)
SUT VIC AB 1 CT1 27XBRD ANBCTR (SUTURE) IMPLANT
SUT VIC AB 2-0 CT1 27 (SUTURE) ×2
SUT VIC AB 2-0 CT1 TAPERPNT 27 (SUTURE) ×1 IMPLANT
SUT VICRYL AB 2 0 TIES (SUTURE) IMPLANT
SUTURE FIBERWR #2 38 T-5 BLUE (SUTURE) ×2 IMPLANT
SYR CONTROL 10ML LL (SYRINGE) ×3 IMPLANT
TOWEL OR 17X24 6PK STRL BLUE (TOWEL DISPOSABLE) ×3 IMPLANT
TOWEL OR 17X26 10 PK STRL BLUE (TOWEL DISPOSABLE) ×3 IMPLANT
TOWER SMARTMIX MINI (MISCELLANEOUS) ×1 IMPLANT
TRAY FOLEY CATH 16FRSI W/METER (SET/KITS/TRAYS/PACK) IMPLANT
TUBE CONNECTING 12'X1/4 (SUCTIONS) ×1
TUBE CONNECTING 12X1/4 (SUCTIONS) ×2 IMPLANT
WATER STERILE IRR 1000ML POUR (IV SOLUTION) ×3 IMPLANT
YANKAUER SUCT BULB TIP NO VENT (SUCTIONS) ×3 IMPLANT

## 2016-03-04 NOTE — Discharge Instructions (Signed)
Ice the shoulder as much as you can  Do exercises 4-5 times per day -  Pendulums, door hinge rotation exercises Lap slides Assisted lift of the arm to 90 degrees  Do not lift push or pull with the arm.  Keep incision clean and dry and intact for one week, then ok to get it wet in the shower.  Follow up in two weeks with Dr Ranell PatrickNorris  [phone]

## 2016-03-04 NOTE — Anesthesia Postprocedure Evaluation (Signed)
Anesthesia Post Note  Patient: Louis Simpson  Procedure(s) Performed: Procedure(s) (LRB): LEFT TOTAL SHOULDER ARTHROPLASTY (Left)  Patient location during evaluation: PACU Anesthesia Type: General and Regional Level of consciousness: awake and alert Pain management: pain level controlled Vital Signs Assessment: post-procedure vital signs reviewed and stable Respiratory status: spontaneous breathing, nonlabored ventilation, respiratory function stable and patient connected to nasal cannula oxygen Cardiovascular status: blood pressure returned to baseline and stable Postop Assessment: no signs of nausea or vomiting Anesthetic complications: no    Last Vitals:  Vitals:   03/04/16 1125 03/04/16 1128  BP: 91/63   Pulse: 81   Resp: 20   Temp:  36.2 C    Last Pain:  Vitals:   03/04/16 1115  TempSrc:   PainSc: 0-No pain                 Lipa Knauff,JAMES TERRILL

## 2016-03-04 NOTE — Brief Op Note (Signed)
03/04/2016  10:22 AM  PATIENT:  Louis Simpson  70 y.o. male  PRE-OPERATIVE DIAGNOSIS:  LEFT SHOULDER OA, END STAGE  POST-OPERATIVE DIAGNOSIS:  LEFT SHOULDER OA, END STAGE  PROCEDURE:  Procedure(s): LEFT TOTAL SHOULDER ARTHROPLASTY (Left) [employer]  SURGEON:  Surgeon(s) and Role:    * Beverely LowSteve Susana Gripp, MD - Primary  PHYSICIAN ASSISTANT:   ASSISTANTS: Thea Gisthomas B Dixon, PA-C  ANESTHESIA:   regional and general  EBL:  Total I/O In: 1850 [I.V.:1600; IV Piggyback:250] Out: 300 [Blood:300]  BLOOD ADMINISTERED:none  DRAINS: none   LOCAL MEDICATIONS USED:  MARCAINE     SPECIMEN:  No Specimen  DISPOSITION OF SPECIMEN:  N/A  COUNTS:  YES  TOURNIQUET:  * No tourniquets in log *  DICTATION: .Other Dictation: Dictation Number [id number]  PLAN OF CARE: Admit to inpatient   PATIENT DISPOSITION:  PACU - hemodynamically stable.   Delay start of Pharmacological VTE agent (>24hrs) due to surgical blood loss or risk of bleeding: not applicable

## 2016-03-04 NOTE — Anesthesia Preprocedure Evaluation (Addendum)
Anesthesia Evaluation  Patient identified by MRN, date of birth, ID band Patient awake    Reviewed: Allergy & Precautions, NPO status , Patient's Chart, lab work & pertinent test results  Airway Mallampati: II  TM Distance: >3 FB Neck ROM: Full    Dental  (+) Teeth Intact, Dental Advisory Given   Pulmonary sleep apnea , former smoker,    breath sounds clear to auscultation       Cardiovascular hypertension,  Rhythm:Regular Rate:Normal     Neuro/Psych  Neuromuscular disease    GI/Hepatic GERD  ,  Endo/Other  diabetesHypothyroidism Morbid obesity  Renal/GU      Musculoskeletal  (+) Arthritis ,   Abdominal (+) + obese,   Peds  Hematology   Anesthesia Other Findings   Reproductive/Obstetrics                            Anesthesia Physical Anesthesia Plan  ASA: III  Anesthesia Plan: General   Post-op Pain Management:  Regional for Post-op pain   Induction: Intravenous  Airway Management Planned:   Additional Equipment:   Intra-op Plan:   Post-operative Plan: Extubation in OR  Informed Consent: I have reviewed the patients History and Physical, chart, labs and discussed the procedure including the risks, benefits and alternatives for the proposed anesthesia with the patient or authorized representative who has indicated his/her understanding and acceptance.     Plan Discussed with:   Anesthesia Plan Comments:         Anesthesia Quick Evaluation

## 2016-03-04 NOTE — Anesthesia Procedure Notes (Signed)
Anesthesia Regional Block:  Interscalene brachial plexus block  Pre-Anesthetic Checklist: ,, timeout performed, Correct Patient, Correct Site, Correct Laterality, Correct Procedure, Correct Position, site marked, Risks and benefits discussed,  Surgical consent,  Pre-op evaluation,  At surgeon's request and post-op pain management  Laterality: Upper and Left  Prep: chloraprep, alcohol swabs       Needles:  Injection technique: Single-shot  Needle Type: Stimulator Needle - 40     Needle Length: 9cm 9 cm Needle Gauge: 22 and 22 G  Needle insertion depth: 5 cm   Additional Needles:  Procedures: ultrasound guided (picture in chart) and nerve stimulator Interscalene brachial plexus block  Nerve Stimulator or Paresthesia:  Response: Twitch elicited, 0.5 mA, 0.3 ms,   Additional Responses:   Narrative:  Start time: 03/04/2016 7:00 AM End time: 03/04/2016 7:15 AM Injection made incrementally with aspirations every 5 mL.  Performed by: Personally  Anesthesiologist: Pansie Guggisberg  Additional Notes: Block assessed prior to start of surgery

## 2016-03-04 NOTE — Interval H&P Note (Signed)
History and Physical Interval Note:  03/04/2016 7:26 AM  Louis Simpson  has presented today for surgery, with the diagnosis of LEFT SHOULDER OA  The various methods of treatment have been discussed with the patient and family. After consideration of risks, benefits and other options for treatment, the patient has consented to  Procedure(s): LEFT TOTAL SHOULDER ARTHROPLASTY (Left) as a surgical intervention .  The patient's history has been reviewed, patient examined, no change in status, stable for surgery.  I have reviewed the patient's chart and labs.  Questions were answered to the patient's satisfaction.     Athea Haley,STEVEN R

## 2016-03-04 NOTE — Anesthesia Procedure Notes (Signed)
Procedure Name: Intubation Date/Time: 03/04/2016 7:41 AM Performed by: Margaree MackintoshYACOUB, Ellena Kamen B Pre-anesthesia Checklist: Patient identified, Emergency Drugs available, Suction available, Patient being monitored and Timeout performed Patient Re-evaluated:Patient Re-evaluated prior to inductionOxygen Delivery Method: Circle system utilized Preoxygenation: Pre-oxygenation with 100% oxygen Intubation Type: IV induction and Rapid sequence Laryngoscope Size: Glidescope and 4 Grade View: Grade I Tube type: Oral Tube size: 7.5 mm Number of attempts: 1 Airway Equipment and Method: Video-laryngoscopy and Stylet Placement Confirmation: ETT inserted through vocal cords under direct vision,  positive ETCO2 and breath sounds checked- equal and bilateral Secured at: 25 cm Tube secured with: Tape Dental Injury: Teeth and Oropharynx as per pre-operative assessment

## 2016-03-04 NOTE — Transfer of Care (Signed)
Immediate Anesthesia Transfer of Care Note  Patient: Louis Simpson  Procedure(s) Performed: Procedure(s): LEFT TOTAL SHOULDER ARTHROPLASTY (Left)  Patient Location: PACU  Anesthesia Type:GA combined with regional for post-op pain  Level of Consciousness: awake, alert  and oriented  Airway & Oxygen Therapy: Patient Spontanous Breathing and Patient connected to nasal cannula oxygen  Post-op Assessment: Report given to RN and Post -op Vital signs reviewed and stable  Post vital signs: Reviewed and stable  Last Vitals:  Vitals:   03/04/16 0627 03/04/16 1017  BP: 118/68   Pulse: 83   Resp: 18   Temp: 36.7 C (P) 36.5 C    Last Pain:  Vitals:   03/04/16 0627  TempSrc: Oral  PainSc:          Complications: No apparent anesthesia complications

## 2016-03-05 LAB — GLUCOSE, CAPILLARY
GLUCOSE-CAPILLARY: 139 mg/dL — AB (ref 65–99)
GLUCOSE-CAPILLARY: 152 mg/dL — AB (ref 65–99)

## 2016-03-05 NOTE — Discharge Summary (Signed)
Physician Discharge Summary   Patient ID: Louis Simpson MRN: [id number] DOB/AGE: 68/08/1945 68 y.o.  Admit date: 03/04/2016 Discharge date: 03/05/2016  Admission Diagnoses:  Active Problems:   S/P shoulder replacement   Discharge Diagnoses:  Same   Surgeries: Procedure(s): LEFT TOTAL SHOULDER ARTHROPLASTY on 03/04/2016   Consultants: OT  Discharged Condition: Stable  Hospital Course: Louis HindersDavid N Simpson is an 70 y.o. male who was admitted 03/04/2016 with a chief complaint of left shoulder pain, and found to have a diagnosis of left shoulder primary OA.  They were brought to the operating room on 03/04/2016 and underwent the above named procedures.    The patient had an uncomplicated hospital course and was stable for discharge.  Recent vital signs:  Vitals:   03/05/16 0200 03/05/16 0606  BP: 120/60 121/67  Pulse: 89 86  Resp:    Temp: 97.9 F (36.6 C) 98.7 F (37.1 C)    Recent laboratory studies:  Results for orders placed or performed during the hospital encounter of 03/04/16  Glucose, capillary  Result Value Ref Range   Glucose-Capillary 126 (H) 65 - 99 mg/dL  Glucose, capillary  Result Value Ref Range   Glucose-Capillary 154 (H) 65 - 99 mg/dL  Glucose, capillary  Result Value Ref Range   Glucose-Capillary 106 (H) 65 - 99 mg/dL    Discharge Medications:     Medication List    TAKE these medications   aspirin EC 81 MG tablet Take 81 mg by mouth daily.   buPROPion 150 MG 24 hr tablet Commonly known as:  WELLBUTRIN XL Take 150 mg by mouth daily.   clonazePAM 1 MG tablet Commonly known as:  KLONOPIN Take 2 mg by mouth at bedtime.   cyclobenzaprine 10 MG tablet Commonly known as:  FLEXERIL Take 10 mg by mouth 2 (two) times daily.   DULoxetine 30 MG capsule Commonly known as:  CYMBALTA Take 30 mg by mouth at bedtime.   DULoxetine 60 MG capsule Commonly known as:  CYMBALTA Take 60 mg by mouth every morning.   glimepiride 1 MG tablet Commonly  known as:  AMARYL Take 1 mg by mouth daily.   levothyroxine 125 MCG tablet Commonly known as:  SYNTHROID, LEVOTHROID Take 125 mcg by mouth daily.   lisinopril 10 MG tablet Commonly known as:  PRINIVIL,ZESTRIL Take 10 mg by mouth daily.   naproxen 500 MG tablet Commonly known as:  NAPROSYN Take 500 mg by mouth every 12 (twelve) hours.   niacin 500 MG tablet Take 500 mg by mouth 2 (two) times daily with a meal.   oxyCODONE-acetaminophen 5-325 MG tablet Commonly known as:  ROXICET Take 1-2 tablets by mouth every 4 (four) hours as needed for severe pain.   traMADol 50 MG tablet Commonly known as:  ULTRAM Take 100 mg by mouth every morning.       Diagnostic Studies: Dg Shoulder Left Port  Result Date: 03/04/2016 CLINICAL DATA:  Status post left shoulder arthroplasty EXAM: LEFT SHOULDER - 1 VIEW COMPARISON:  None. FINDINGS: Status post left shoulder arthroplasty, with well-positioned left proximal humerus and left glenoid prostheses. No evidence of dislocation at the left glenohumeral joint on this single frontal view. No osseous fracture. No suspicious focal osseous lesion. Expected soft tissue gas surrounding the left shoulder joint. IMPRESSION: Status post left total shoulder arthroplasty, with no evidence of malalignment on this single frontal view. Electronically Signed   By: Delbert PhenixJason A Poff M.D.   On: 03/04/2016 11:51    Disposition: home  Follow-up Information    Lucas Winograd,STEVEN R, MD. Call in 2 weeks.   Specialty:  Orthopedic Surgery Why:  [phone] Contact information: [address] [place] [address] [phone]            Signed: Verlee Rossetti 03/05/2016, 6:32 AM

## 2016-03-05 NOTE — Care Management Note (Signed)
Case Management Note  Patient Details  Name: Louis Simpson MRN: [id number] Date of Birth: 02/24/1946  Subjective/Objective:                  left shoulder replacement on 03/04/2016  Action/Plan: CM spoke to patient and spouse about HHPT/OT and they chose [facility]. Hemi walker [facility] with [facility] per patient agreement and request. [facility] with Vira Blancoona at [facility]. Patient denied further needs at this time.   Expected Discharge Date:  03/05/16              Expected Discharge Plan:  Home w Home Health Services  In-House Referral:     Discharge planning Services  CM Consult  Post Acute Care Choice:  Home Health, Durable Medical Equipment Choice offered to:  Patient, Spouse  DME Arranged:  Walker hemi DME Agency:  [facility]  HH Arranged:  PT, OT HH Agency[facility]  [facility]  Status of Service:  Completed, signed off  If discussed at [employer] Length of Stay Meetings, dates discussed:    Additional Comments:  Darcel SmallingAnna C Jeanee Fabre, RN 03/05/2016, 3:09 PM

## 2016-03-05 NOTE — Evaluation (Addendum)
Occupational Therapy Evaluation Patient Details Name: ROBEN SCHLIEP MRN: [id number] DOB: 01/28/1946 Today's Date: 03/05/2016    History of Present Illness Patient had a left shoulder replacement on 03/04/2016.  PMH includes chronic inflammatory demylenating disease, lymes diseease, tremors, DMII, and HTN.   Clinical Impression   Pt admitted with above. He demonstrates the below listed deficits and will benefit from continued OT to maximize safety and independence with BADLs.  Pt was instructed in HEP seated (see comments below).  He is very unsteady on his feet, and appears to be very high fall risk.  Pt reports he is always unsteady and contributes changes to medications.  He insists he will be fine and will use w/c at home.  His wife is not yet here, so will await her arrival to complete ADL and sling education and to better assess his safety issues and baseline status.       Follow Up Recommendations  Home health OT;Supervision/Assistance - 24 hour    Equipment Recommendations  None recommended by OT    Recommendations for Other Services       Precautions / Restrictions Precautions Precautions: Shoulder;Fall Type of Shoulder Precautions: active:  FF 90, abduction 0; ER 30.  Minimize load bearing  Shoulder Interventions: Off for dressing/bathing/exercises Precaution Booklet Issued: Yes (comment) Precaution Comments: shoulder hand out provided and writtent HEP provided  Required Braces or Orthoses: Sling Restrictions Weight Bearing Restrictions: Yes LUE Weight Bearing: Non weight bearing      Mobility Bed Mobility Overal bed mobility: Modified Independent             General bed mobility comments: with strong dependency on bedrail.  Discussed option of pt sleeping in recliner at home   Transfers Overall transfer level: Needs assistance Equipment used: 1 person hand held assist Transfers: Sit to/from [employer] Sit to Stand: Min assist Stand  pivot transfers: Mod assist       General transfer comment: Pt very unsteady with bil. knee buckling.  He attempted to reach out and steady himself with Lt UE but instructed not to.  Pt reports he is always unsteady and that he will be fine and will use w/c at home.      Balance Overall balance assessment: Needs assistance Sitting-balance support: Feet supported Sitting balance-Leahy Scale: Good     Standing balance support: Single extremity supported Standing balance-Leahy Scale: Poor Standing balance comment: pt requires min - mod A                             ADL                                    Vision     Perception     Praxis      Pertinent Vitals/Pain Pain Assessment: 0-10 Pain Score: 7  Pain Location: Lt shoulder  Pain Descriptors / Indicators: Aching;Operative site guarding;Sore Pain Intervention(s): Limited activity within patient's tolerance;Monitored during session;Premedicated before session;Repositioned;Ice applied     Hand Dominance Right   Extremity/Trunk Assessment Upper Extremity Assessment Upper Extremity Assessment: LUE deficits/detail LUE Deficits / Details: Lt shoulder replacement    Lower Extremity Assessment Lower Extremity Assessment: Defer to PT evaluation   Cervical / Trunk Assessment Cervical / Trunk Assessment: Kyphotic   Communication Communication Communication: No difficulties   Cognition Arousal/Alertness: Awake/alert Behavior During Therapy: [facility]  for tasks assessed/performed Overall Cognitive Status: Within Functional Limits for tasks assessed                     General Comments       Exercises Exercises: Other exercises Other Exercises Other Exercises: In supine, pt instructed AAROM shoulder flexion, however, he required assist to prevent abduction of Lt. UE.  Pt was moved into sitting position and was able to perform safely in sitting x 10 reps to 45*.   He performed ER to ~10* while  seated EOB - reinforced his limit is 30* and instructed him where that was.  Also reinforced need to keep elbow tightly to his side Other Exercises: Pt performed 10 reps elbow flex/ext actively supine as well as wrist flex/ext and finger flex/ext  Other Exercises: Pt performed neck ROM - 10 reps flex/ext, rotation Lt and Rt, and lateral flex lt and Rt    Shoulder Instructions      Home Living Family/patient expects to be discharged to:: Private residence Living Arrangements: Spouse/significant other Available Help at Discharge: Family Type of Home: House Home Access: Ramped entrance     Home Layout: Two level;Able to live on main level with bedroom/bathroom     Bathroom Shower/Tub: Producer, television/film/videoWalk-in shower   Bathroom Toilet: Handicapped height     Home Equipment: Cane - single point;Shower seat;Wheelchair - manual   Additional Comments: wife is supportive and able to assist as needed       Prior Functioning/Environment Level of Independence: Independent with assistive device(s)        Comments: Pt unsteady on his feet.  He and wife report occasional fall.  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         OT Problem List: Decreased strength;Decreased range of motion;Decreased activity tolerance;Impaired balance (sitting and/or standing);Decreased knowledge of use of DME or AE;Decreased knowledge of precautions;Obesity;Impaired UE functional use;Pain   OT Treatment/Interventions: Self-care/ADL training;Therapeutic exercise;DME and/or AE instruction;Therapeutic activities;Patient/family education    OT Goals(Current goals can be found in the care plan section) Acute Rehab OT Goals Patient Stated Goal: to ride his horse  OT Goal Formulation: With patient Time For Goal Achievement: 03/12/16 Potential to Achieve Goals: Good ADL Goals Pt/caregiver will Perform Home Exercise Program: With written HEP provided;Independently (for active protoccol TSA) Additional ADL Goal #1: spouse will  safely assist pt with UB bathing and dressing  Additional ADL Goal #2: Pt/spouse will be independent with sling wear and care  Additional ADL Goal #3: Pt/spouse will be independent with proper positioning Lt UE  Additional ADL Goal #4: Pt and spouse will be independent with precautions   OT Frequency: Min 2X/week   Barriers to D/C:            Co-evaluation              End of Session Equipment Utilized During Treatment: Other (comment) (sling ) Nurse Communication: Mobility status  Activity Tolerance:   Patient left: in chair;with call bell/phone within reach   Time: 1029-1102 OT Time Calculation (min): 33 min Charges:  OT General Charges $OT Visit: 1 Procedure OT Treatments $Therapeutic Exercise: 8-22 mins G-Codes:    Andria Head M 03/05/2016, 5:24 PM

## 2016-03-05 NOTE — Progress Notes (Signed)
Patient provided with discharge instructions and prescriptions.  Additional dressing material provided per MD request.  Patient educated on how to change dressing, education repeated with wife upon arrival.  Education and Teach Back related to discharge completed.    Discharge delayed slightly due to recommendation of OT to add PT consult.  PT consulted and suggested hemi walker and home health.  Home health arranged by case management.  Hemi-walker was delivered by DME rep.    Patient's wife expressed some concern with the wait for the hemi walker.  RN and Director of Nursing [employer] Smith apologized for the delay.  Hemiwalker arrived prior to discharge.  Patient escorted to vehicle by Melvenia Needlesherie Smith.

## 2016-03-05 NOTE — Op Note (Signed)
NAMEDUNCAN, [name]:  [id number]  MEDICAL RECORD NO.:  [id number]  LOCATION:  5N31C                        FACILITY:  [facility]  PHYSICIAN:  Almedia Balls. Ranell Patrick, M.D. DATE OF BIRTH:  11-Jun-1945  DATE OF PROCEDURE:  03/04/2016 DATE OF DISCHARGE:                              OPERATIVE REPORT   PREOPERATIVE DIAGNOSIS:  Left shoulder end-stage osteoarthritis.  POSTOPERATIVE DIAGNOSIS:  Left shoulder end-stage osteoarthritis.  PROCEDURE PERFORMED:  Left total shoulder arthroplasty using DePuy Global Unite System.  ATTENDING SURGEON:  Almedia Balls. Ranell Patrick, MD.  ASSISTANT:  Louis Simpson, [place], who was scrubbed the entire procedure and necessary for satisfactory completion of surgery.  ANESTHESIA:  General anesthesia was used plus interscalene block.  ESTIMATED BLOOD LOSS:  Less than 100 mL.  FLUID REPLACEMENT:  1500 mL crystalloid.  INSTRUMENT COUNTS:  Correct.  COMPLICATIONS:  There were no complications.  ANTIBIOTICS:  Perioperative antibiotics were given.  INDICATIONS:  The patient is a 70 year old male with worsening left shoulder pain secondary to end-stage arthritis.  The patient has had progressive pain despite conservative management, has bone-on-bone arthritis based on x-ray.  The patient presents now having failed conservative management and desiring operative treatment to restore function and eliminate pain in the shoulder.  Informed consent obtained.  DESCRIPTION OF PROCEDURE:  After an adequate level of anesthesia achieved, the patient was positioned in the modified beach-chair position.  Left shoulder correctly identified and sterilely prepped and draped in usual manner.  Time-out called.  Standard deltopectoral incision started at the coracoid process and extending down to the anterior humerus.  Dissection carried out down through the subcutaneous tissues.  The cephalic vein identified and taken laterally with the deltoid, pectoralis taken  medially.  Conjoint tendon identified and retracted medially.  The subscapularis taken off subperiosteally off the lesser tuberosity and tagged for repair with #2 FiberWire suture.  We then released the inferior capsule off the neck of the humerus and progressively externally rotated.  I then placed a T-handled Crego elevator over the top of the humeral head underneath the rotator cuff and then a large Crego inferiorly protecting the lower soft tissue structures and then used an oscillating saw, placed the elbow at the patient's side in about 30 degrees of external rotation thus a 30-degree retroverted cut.  We used an oscillating saw, made our cut which was even with the rotator cuff insertion site.  We then took off remaining osteophytes off the inferior humerus and posterior humerus and then found our intramedullary canal with a 6-mm reamer and reamed up to a size 14.  We then broached for the 12 and the 14 at the appropriate 30 degrees eversion.  Once we had the broach done, we impacted the trial stem which was a 14 and 14 Global Unite System, then we removed the trial stem.  We subluxed the humerus posteriorly, did a 360-degree glenoid labrum removal and capsule removal carefully protecting the axillary nerve.  We had excellent 360-degree exposure of the glenoid face.  We found our center point of the guide pin and reamed for the 48 glenoid component.  We did our peripheral hand reaming, then drilled our central peg hole and  our 3 peripheral holes using the appropriate guide. Once these holes were drilled, we placed a trial 48 glenoid, this was an APG glenoid, impacted that into position, made sure that it would seat fully and then took Gelfoam and soaked thrombin and put that in 3 peripheral holes.  We then cemented the APG glenoid size 48 into position.  We only used cement in the 3 peripheral holes and impacted that and held until the cement was completely cured.  We checked and  the glenoid was nice and stable.  We then went ahead and went back to the humeral side.  We irrigated the canal thoroughly.  We then drilled holes and placed #2 FiberWire suture in the lesser tuberosity for repair of the subscapularis.  We then impacted the real [place] of 14 body and 14 metaphysis Global Unite stem in 30 degrees of retroversion and impacted that about a millimeter proud but could not get that down anymore and felt that was acceptable on the humeral side.  We went back and broached for one more time and we were happy with that about a millimeter being proud.  So thus we selected a 48 x 18 eccentric humeral head and trialed first with that, we had good coverage and we selected the real 48 x 18 humeral head and impacted that in position, dialed it superiorly and posteriorly for the eccentricity.  Next, we reduced the shoulder and had excellent stability.  We repaired the subscapularis anatomically back to bone using the sutures that were already through the drill holes and also sutures through bone and through the biceps and remaining soft tissue sleeve and rotator interval.  We had a solid repair.  We were able to range his shoulder up to 45 degrees of external rotation with no tension on the repair site, nice and stable, and then thoroughly irrigated the subdeltoid and subacromial interval and then repaired the deltopectoral interval with 0 Vicryl suture followed by 2-0 Vicryl for subcutaneous closure and 4-0 Monocryl for skin.  Steri-Strips applied followed by sterile dressing.  The patient tolerated the surgery well.     Almedia BallsSteven R. Ranell PatrickNorris, M.D.     SRN/MEDQ  D:  03/04/2016  T:  03/05/2016  Job:  [id number]

## 2016-03-05 NOTE — Progress Notes (Signed)
Occupational therapy treatment note:  Wife present.  She was able to safely assist pt with UB ADLs and sling wear.  She was instructed in proper positioning, UE HEP (written info provided) and precautions.  He is heavily dependent on use of UEs for balance and fear that he may attempt to overuse Lt UE is he attempts to stand with UE not slinged during ADLs.  Instructed he and wife to keep Lt UE slinged at ALL times unless bathing/dressing, and exercise.  THey verbalize understanding.  Recommend PT consult due to significant balance deficits.  And Recommend HHOT.     03/05/16 1711  OT Visit Information  Last OT Received On 03/05/16  Assistance Needed +1  History of Present Illness Patient had a left shoulder replacement on 03/04/2016.  PMH includes chronic inflammatory demylenating disease, lymes diseease, tremors, DMII, and HTN.  Precautions  Precautions Shoulder;Fall  Type of Shoulder Precautions active:  FF 90, abduction 0; ER 30.  Minimize load bearing   Shoulder Interventions Off for dressing/bathing/exercises  Precaution Booklet Issued Yes (comment)  Precaution Comments shoulder hand out provided and writtent HEP provided   Required Braces or Orthoses Sling  Pain Assessment  Pain Assessment 0-10  Pain Score 6  Pain Location Lt shoulder   Pain Descriptors / Indicators Aching;Operative site guarding  Pain Intervention(s) Limited activity within patient's tolerance;Monitored during session;Premedicated before session;Ice applied  Cognition  Arousal/Alertness Awake/alert  Behavior During Therapy WFL for tasks assessed/performed  Overall Cognitive Status Within Functional Limits for tasks assessed  ADL  Overall ADL's  Needs assistance/impaired  Upper Body Bathing Minimal assitance;Sitting  Upper Body Bathing Details (indicate cue type and reason) Pt was instructed to lean forward to flex shoulder away from body and to avoid abducting shoulder to bathe axilla  Lower Body Bathing Moderate  assistance;Sit to/from stand  Lower Body Bathing Details (indicate cue type and reason) Pt requires assist to bathe peri area as he is unable to unweight Rt UE while standing.  He requires assist for feet   Upper Body Dressing  Moderate assistance;Sitting  Upper Body Dressing Details (indicate cue type and reason) pt and wife instructed in proper technique  Lower Body Dressing Maximal assistance;Sit to/from stand  Lower Body Dressing Details (indicate cue type and reason) requires assist to thread pants over feet and to pull pants over hips due to unsteadiness   Toileting- Clothing Manipulation and Hygiene Maximal assistance;Sit to/from stand  Functional mobility during ADLs Minimal assistance;Moderate assistance  General ADL Comments Wife and pt instructed to consider use of button front shirt vs. large t-shirt to improve ease of UB dressing.  Wife instructed in how to don/doff sling and in pt precautions   Bed Mobility  General bed mobility comments pt and wife instructed in proper positioning for Lt UE when supine   Balance  Sitting-balance support Feet supported  Sitting balance-Leahy Scale Good  Standing balance support Single extremity supported  Standing balance-Leahy Scale Poor  Restrictions  Weight Bearing Restrictions Yes  LUE Weight Bearing NWB  Transfers  Overall transfer level Needs assistance  Equipment used Rolling walker (2 wheeled) (supporting self with Rt UE only during standing )  Transfers Sit to/from Stand  Sit to Stand Min guard  Stand pivot transfers Min guard  General transfer comment wife reports pt unsteady normally, but it is much worse now.  She voices concerns.  RN notified and PT consult obtained  Exercises  Exercises Other exercises  Other Exercises  Other Exercises wife instructed  in pt HEP and he was able to demonstrate understanding.  He performed 10 reps shoulder FF to ~45* AAROM, and ER to 0-10* actively in seated position (pt tends to abduct Lt UE  when supine)  Other Exercises Pt performed 10 reps elbow flex/ext actively supine as well as wrist flex/ext and finger flex/ext   Other Exercises Pt performed neck ROM - 10 reps flex/ext, rotation Lt and Rt, and lateral flex lt and Rt   OT - End of Session  Equipment Utilized During Treatment Rolling walker  Activity Tolerance Patient tolerated treatment well  Patient left in chair;with call bell/phone within reach;with family/visitor present  Nurse Communication Other (comment) (recommendation for PT consule )  OT Assessment/Plan  OT Plan Discharge plan remains appropriate  OT Frequency (ACUTE ONLY) Min 2X/week  Follow Up Recommendations Home health OT;Supervision/Assistance - 24 hour  OT Equipment None recommended by OT  OT Goal Progression  Progress towards OT goals Progressing toward goals  OT Time Calculation  OT Start Time (ACUTE ONLY) 1209  OT Stop Time (ACUTE ONLY) 1252  OT Time Calculation (min) 43 min  OT General Charges  $OT Visit 1 Procedure  OT Treatments  $Self Care/Home Management  38-52 mins  [employer], OTR/L [phone]

## 2016-03-05 NOTE — Progress Notes (Signed)
Physical Therapy Evaluation Patient Details Name: Louis Simpson MRN: [id number] DOB: 1945-09-04 Today's Date: 03/05/2016   History of Present Illness  Patient had a left shoulder replacement on 03/04/2016.  PMH includes chronic inflammatory demylenating disease, lymes diseease, tremors, DMII, and HTN.  Clinical Impression  Patient uses a regular walker at home for safety and balance but at this time he can not use his left upper extremity. He is too unsteady for a cane. He was able to ambulate 20's w/ a hemiwalker with cues for safety. He would benefit from further training with the hemi walker at home as well as a home safety assessment to decrease his fall risk when using the walker at home. At this time he is a fall risk.     Follow Up Recommendations Home health PT    Equipment Recommendations  Other (comment) (hemi walker )    Recommendations for Other Services Rehab consult     Precautions / Restrictions Precautions Precautions: None Restrictions Weight Bearing Restrictions: Yes LUE Weight Bearing: Non weight bearing      Mobility  Bed Mobility Overal bed mobility: Independent                Transfers Overall transfer level: Needs assistance Equipment used: Hemi-walker Transfers: Sit to/from Stand Sit to Stand: Min guard         General transfer comment: Guarding for balance with hemi walker. Cuing to take his time and pay attention to were his feet are.   Ambulation/Gait Ambulation/Gait assistance: Min guard Ambulation Distance (Feet): 20 Feet Assistive device: Hemi-walker       General Gait Details: Heavey lean onto hemi walker. Decreased coordination of lower extremitys   Stairs            Wheelchair Mobility    Modified Rankin (Stroke Patients Only)       Balance Overall balance assessment: Needs assistance Sitting-balance support: No upper extremity supported Sitting balance-Leahy Scale: Good     Standing balance support:  Single extremity supported Standing balance-Leahy Scale: Poor Standing balance comment: SIngle upper extremity needed but can only use his left upper extremity                              Pertinent Vitals/Pain Pain Assessment: 0-10 Pain Score: 2  Pain Location: Left shoulder  Pain Descriptors / Indicators: Aching Pain Intervention(s): Limited activity within patient's tolerance;Monitored during session;Premedicated before session;Utilized relaxation techniques    Home Living Family/patient expects to be discharged to:: Private residence Living Arrangements: Spouse/significant other Available Help at Discharge: Family Type of Home: House Home Access: Ramped entrance     Home Layout: Two level;Able to live on main level with bedroom/bathroom Home Equipment: None      Prior Function Level of Independence: Independent with assistive device(s)         Comments: 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 He hads frequent LOB and tremors.      Hand Dominance   Dominant Hand: Right    Extremity/Trunk Assessment   Upper Extremity Assessment: Defer to OT evaluation           Lower Extremity Assessment: Generalized weakness         Communication   Communication: No difficulties  Cognition Arousal/Alertness: Awake/alert Behavior During Therapy: WFL for tasks assessed/performed Overall Cognitive Status: Within Functional Limits for tasks assessed  General Comments      Exercises     Assessment/Plan    PT Assessment Patient needs continued PT services  PT Problem List Decreased strength;Decreased range of motion;Decreased activity tolerance;Decreased balance;Decreased mobility;Decreased coordination;Decreased safety awareness;Decreased knowledge of precautions;Pain;Impaired sensation          PT Treatment Interventions DME instruction;Gait training;Stair training;Therapeutic activities;Functional mobility training;Therapeutic  exercise;Patient/family education;Manual techniques;Modalities;Balance training;Neuromuscular re-education    PT Goals (Current goals can be found in the Care Plan section)  Acute Rehab PT Goals Patient Stated Goal: to go home safely  PT Goal Formulation: With patient/family Time For Goal Achievement: 03/19/16 Potential to Achieve Goals: Good    Frequency Min 5X/week   Barriers to discharge Other (comment) (needs hemi walker for safety )      Co-evaluation               End of Session Equipment Utilized During Treatment: Gait belt Activity Tolerance: Patient tolerated treatment well   Nurse Communication: Mobility status;Other (comment) (need for hemi walker and home health )         Time: 1350-1410 PT Time Calculation (min) (ACUTE ONLY): 20 min   Charges:   PT Evaluation PT DPT  $PT Eval Low Complexity: 1 Procedure      PT G Codes:        Dessie ComaDavid J Laray Corbit 03/05/2016, 2:46 PM

## 2016-03-05 NOTE — Plan of Care (Signed)
Problem: Pain Managment: Goal: General experience of comfort will improve Outcome: Progressing Medicated with Oxycodone and Dilaudid with moderate relief  Problem: Physical Regulation: Goal: Will remain free from infection Outcome: Progressing No S/S of infection. VS WNL  Problem: Tissue Perfusion: Goal: Risk factors for ineffective tissue perfusion will decrease Outcome: Progressing Denies S/S of DVT  Problem: Activity: Goal: Risk for activity intolerance will decrease Outcome: Progressing Tolerates activities well  Problem: Bowel/Gastric: Goal: Will not experience complications related to bowel motility Outcome: Progressing No bowel issues reported

## 2016-03-05 NOTE — Progress Notes (Signed)
Orthopedics Progress Note  Subjective: Patient reports block worn off and some pain this morning  Objective:  Vitals:   03/05/16 0200 03/05/16 0606  BP: 120/60 121/67  Pulse: 89 86  Resp:    Temp: 97.9 F (36.6 C) 98.7 F (37.1 C)    General: Awake and alert  Musculoskeletal: left shoulder dressing CDI, wiggles fingers Neurovascularly intact  Lab Results  Component Value Date   WBC 6.3 02/29/2016   HGB 14.3 02/29/2016   HCT 41.5 02/29/2016   MCV 90.4 02/29/2016   PLT 190 02/29/2016       Component Value Date/Time   NA 136 02/29/2016 1052   K 4.5 02/29/2016 1052   CL 99 (L) 02/29/2016 1052   CO2 25 02/29/2016 1052   GLUCOSE 130 (H) 02/29/2016 1052   BUN 21 (H) 02/29/2016 1052   CREATININE 1.55 (H) 02/29/2016 1052   CALCIUM 10.0 02/29/2016 1052   GFRNONAA 44 (L) 02/29/2016 1052   GFRAA 51 (L) 02/29/2016 1052    Lab Results  Component Value Date   INR 1.0 03/05/2009    Assessment/Plan: POD #1 s/p Procedure(s): LEFT TOTAL SHOULDER ARTHROPLASTY Discharge home after therapy and lunch today if comfortable and getting around ok Follow up in two weeks in the office  Viviann SpareSteven R. Ranell PatrickNorris, MD 03/05/2016 6:30 AM

## 2016-03-07 ENCOUNTER — Encounter (HOSPITAL_COMMUNITY): Payer: Self-pay | Admitting: Orthopedic Surgery

## 2017-06-24 IMAGING — CR DG SHOULDER 1V*L*
1 series · 1 of 1 positions shown · non-contrast
Comparison: None.

CLINICAL DATA: Status post left shoulder arthroplasty

EXAM:
LEFT SHOULDER - 1 VIEW

[AP]
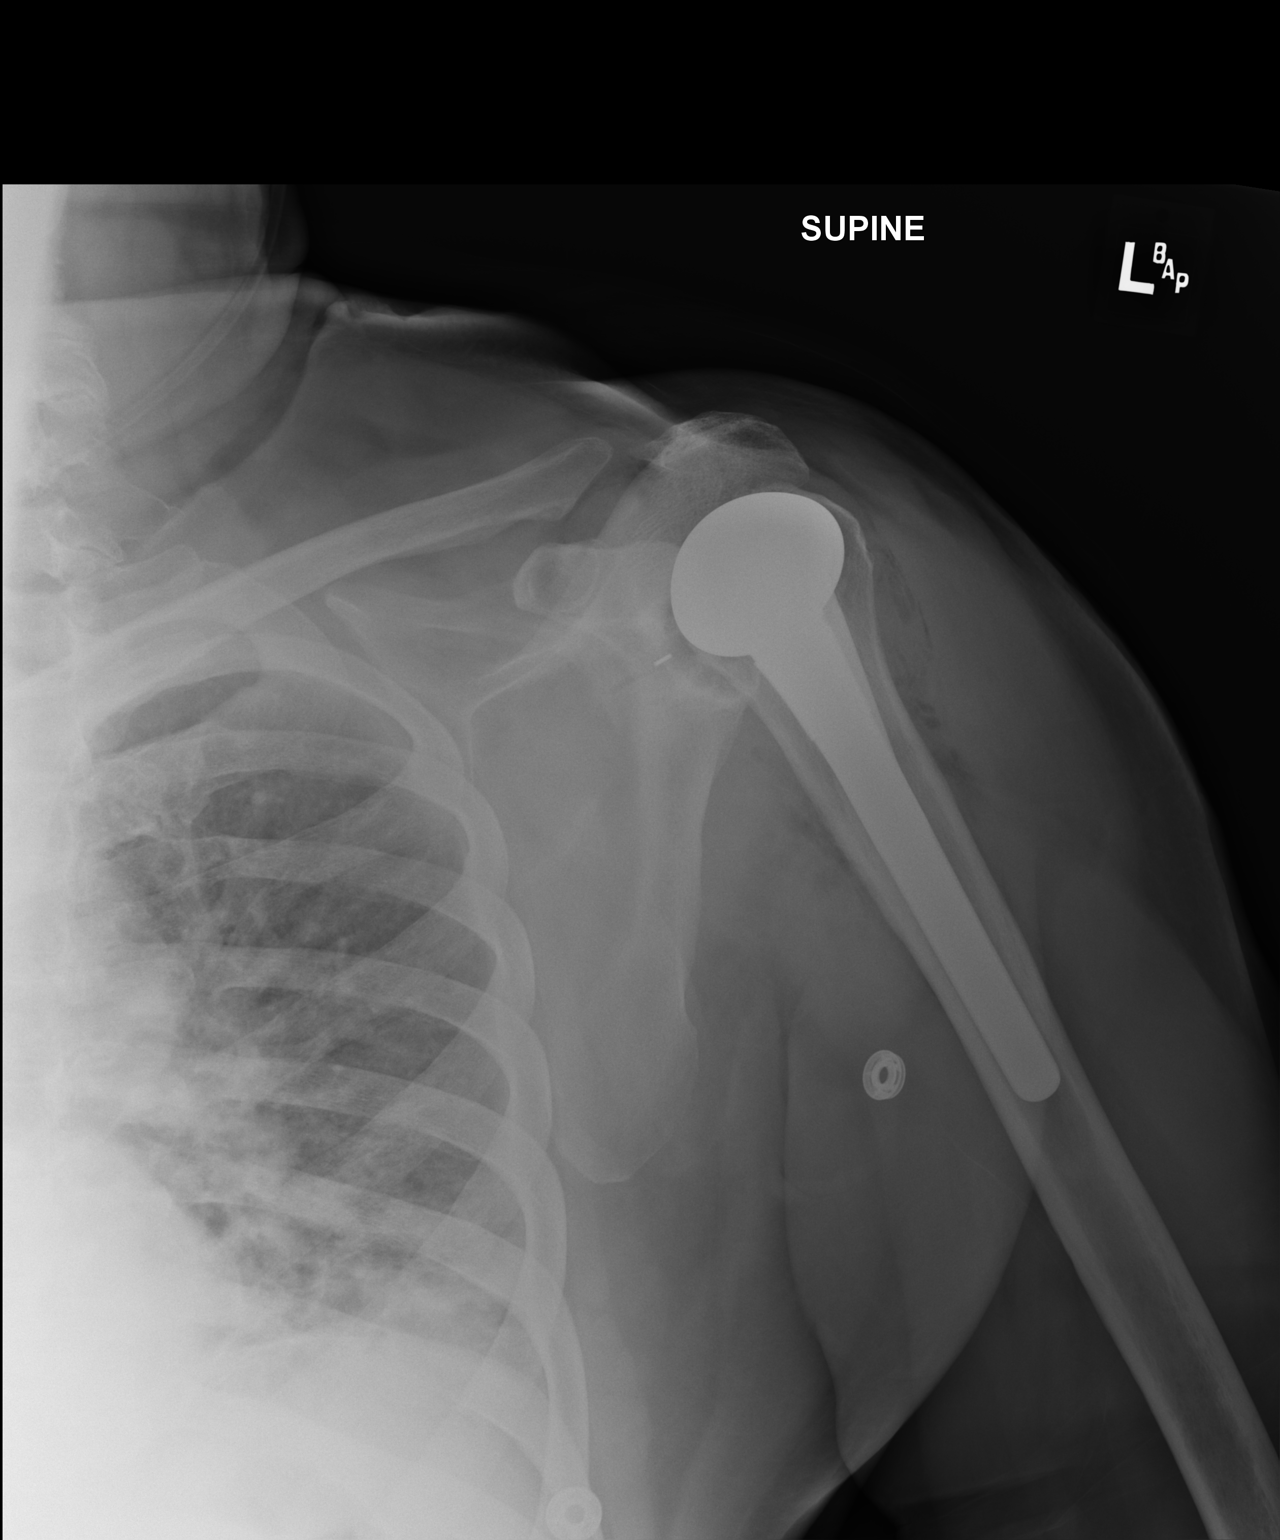

[1 of 1 positions shown; findings below may reference images not displayed]

FINDINGS: Status post left shoulder arthroplasty, with well-positioned left
proximal humerus and left glenoid prostheses. No evidence of
dislocation at the left glenohumeral joint on this single frontal
view. No osseous fracture. No suspicious focal osseous lesion.
Expected soft tissue gas surrounding the left shoulder joint.
IMPRESSION: Status post left total shoulder arthroplasty, with no evidence of
malalignment on this single frontal view.

## 2021-09-28 NOTE — H&P (Signed)
?Patient's anticipated LOS is less than 2 midnights, meeting these requirements: ?- Younger than 56 ?- Lives within 1 hour of care ?- Has a competent adult at home to recover with post-op recover ?- NO history of ? - Chronic pain requiring opiods ? - Diabetes ? - Coronary Artery Disease ? - Heart failure ? - Heart attack ? - Stroke ? - DVT/VTE ? - Cardiac arrhythmia ? - Respiratory Failure/COPD ? - Renal failure ? - Anemia ? - Advanced Liver disease ? ?  ? ?Louis Simpson is an 76 y.o. male.   ? ?Chief Complaint: right shoulder pain ? ?HPI: Pt is a 76 y.o. male complaining of right shoulder pain for multiple years. Pain had continually increased since the beginning. X-rays in the clinic show end-stage arthritic changes of the right shoulder. Pt has tried various conservative treatments which have failed to alleviate their symptoms, including injections and therapy. Various options are discussed with the patient. Risks, benefits and expectations were discussed with the patient. Patient understand the risks, benefits and expectations and wishes to proceed with surgery.  ? ?PCP:  Raina Mina., MD ? ?D/C Plans: Home ? ?PMH: ?Past Medical History:  ?Diagnosis Date  ? Arthritis   ? 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 CIDP (chronic inflammatory demyelinating polyneuropathy) (HCC)   ? Depression   ? Diabetes mellitus without complication ([place])   ? GERD (gastroesophageal reflux disease)   ? Hypertension   ? Hypothyroidism   ? Lyme disease   ? Osteoarthritis   ? Sleep apnea   ? years ago tested at [facility], does not wear CPAP  ? Tremors of nervous system   ? ? ?PSH: ?Past Surgical History:  ?Procedure Laterality Date  ? APPENDECTOMY    ? BACK SURGERY    ? lumbar surgery  ? CATARACT EXTRACTION W/ INTRAOCULAR LENS IMPLANT Bilateral   ? COLONOSCOPY    ? HIP SURGERY    ? broke pelvis, screws on right side  ? TOTAL KNEE ARTHROPLASTY Bilateral   ? TOTAL SHOULDER ARTHROPLASTY Left 03/04/2016  ? Procedure: LEFT TOTAL SHOULDER  ARTHROPLASTY;  Surgeon: Netta Cedars, MD;  Location: [facility];  Service: Orthopedics;  Laterality: Left;  ? ? ?Social History:  reports that he has quit smoking. His smoking use included cigarettes. He has never used smokeless tobacco. He reports current alcohol use. He reports that he does not use drugs. ? ?Allergies:  ?Allergies  ?Allergen Reactions  ? Latex Hives and Itching  ? Morphine And Related Itching  ?  welps on body  ? ? ?Medications: ?No current facility-administered medications for this encounter.  ? ?Current Outpatient Medications  ?Medication Sig Dispense Refill  ? aspirin EC 81 MG tablet Take 81 mg by mouth daily.    ? buPROPion (WELLBUTRIN XL) 150 MG 24 hr tablet Take 150 mg by mouth daily.  0  ? clonazePAM (KLONOPIN) 1 MG tablet Take 2 mg by mouth at bedtime.  0  ? cyclobenzaprine (FLEXERIL) 10 MG tablet Take 10 mg by mouth 2 (two) times daily.  1  ? DULoxetine (CYMBALTA) 30 MG capsule Take 30 mg by mouth at bedtime.  5  ? DULoxetine (CYMBALTA) 60 MG capsule Take 60 mg by mouth every morning.  0  ? glimepiride (AMARYL) 1 MG tablet Take 1 mg by mouth daily.  0  ? levothyroxine (SYNTHROID, LEVOTHROID) 125 MCG tablet Take 125 mcg by mouth daily.  1  ? lisinopril (PRINIVIL,ZESTRIL) 10 MG tablet  Take 10 mg by mouth daily.  1  ? naproxen (NAPROSYN) 500 MG tablet Take 500 mg by mouth every 12 (twelve) hours.  1  ? niacin 500 MG tablet Take 500 mg by mouth 2 (two) times daily with a meal.    ? oxyCODONE-acetaminophen (ROXICET) 5-325 MG tablet Take 1-2 tablets by mouth every 4 (four) hours as needed for severe pain. 60 tablet 0  ? traMADol (ULTRAM) 50 MG tablet Take 100 mg by mouth every morning.   1  ? ? ?No results found for this or any previous visit (from the past 48 hour(s)). ?No results found. ? ?ROS: ?Pain with rom of the right upper extremity ? ?Physical Exam: ?Alert and oriented 76 y.o. male in no acute distress ?Cranial nerves 2-12 intact ?Cervical spine: full rom with no tenderness, nv intact  distally ?Chest: active breath sounds bilaterally, no wheeze rhonchi or rales ?Heart: regular rate and rhythm, no murmur ?Abd: non tender non distended with active bowel sounds ?Hip is stable with rom  ?Right shoulder painful and weak rom ?Nv intact distally ?No rashes or edema distally ? ?Assessment/Plan ?Assessment: right shoulder cuff arthropathy ? ?Plan: ? ?Patient will undergo a right reverse total shoulder by Dr. Veverly Fells at [facility] benefits and expectations were discussed with the patient. Patient understand risks, benefits and expectations and wishes to proceed. ?Preoperative templating of the joint replacement has been completed, documented, and submitted to the Operating Room personnel in order to optimize intra-operative equipment management.  ? ?Merla Riches PA-C, MPAS ?[facility] ?[address], [address], [place], [place] [address] ?Phone: [phone] ?[email or web address] ?Facebook  Engineer, structural  ? ?  ? ?

## 2021-10-05 NOTE — H&P (Signed)
?Patient's anticipated LOS is less than 2 midnights, meeting these requirements: ?- Younger than 65 ?- Lives within 1 hour of care ?- Has a competent adult at home to recover with post-op recover ?- NO history of ? - Chronic pain requiring opiods ? - Diabetes ? - Coronary Artery Disease ? - Heart failure ? - Heart attack ? - Stroke ? - DVT/VTE ? - Cardiac arrhythmia ? - Respiratory Failure/COPD ? - Renal failure ? - Anemia ? - Advanced Liver disease ? ?  ? ?Louis Simpson is an 76 y.o. male.   ? ?Chief Complaint: right shoulder pain ? ?HPI: Pt is a 75 y.o. male complaining of right shoulder pain for multiple years. Pain had continually increased since the beginning. X-rays in the clinic show end-stage arthritic changes of the right shoulder. Pt has tried various conservative treatments which have failed to alleviate their symptoms, including injections and therapy. Various options are discussed with the patient. Risks, benefits and expectations were discussed with the patient. Patient understand the risks, benefits and expectations and wishes to proceed with surgery.  ? ?PCP:  Gordan Payment., MD ? ?D/C Plans: Home ? ?PMH: ?Past Medical History:  ?Diagnosis Date  ? Arthritis   ? 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 CIDP (chronic inflammatory demyelinating polyneuropathy) (HCC)   ? Depression   ? Diabetes mellitus without complication (HCC)   ? GERD (gastroesophageal reflux disease)   ? Hypertension   ? Hypothyroidism   ? Lyme disease   ? Osteoarthritis   ? Sleep apnea   ? years ago tested at [facility], does not wear CPAP  ? Tremors of nervous system   ? ? ?PSH: ?Past Surgical History:  ?Procedure Laterality Date  ? APPENDECTOMY    ? BACK SURGERY    ? lumbar surgery  ? CATARACT EXTRACTION W/ INTRAOCULAR LENS IMPLANT Bilateral   ? COLONOSCOPY    ? HIP SURGERY    ? broke pelvis, screws on right side  ? TOTAL KNEE ARTHROPLASTY Bilateral   ? TOTAL SHOULDER ARTHROPLASTY Left 03/04/2016  ? Procedure: LEFT TOTAL SHOULDER  ARTHROPLASTY;  Surgeon: Beverely Low, MD;  Location: [facility] OR;  Service: Orthopedics;  Laterality: Left;  ? ? ?Social History:  reports that he has quit smoking. His smoking use included cigarettes. He has never used smokeless tobacco. He reports current alcohol use. He reports that he does not use drugs. ?BMI: ?Estimated body mass index is 32.71 kg/m? as calculated from the following: ?  Height as of 02/29/16: 6\' 4"  (1.93 m). ?  Weight as of 02/29/16: 121.9 kg. ? ?Lab Results  ?Component Value Date  ? ALBUMIN 4.0 03/05/2009  ? ?Diabetes: ?Patient does not have a diagnosis of diabetes. ?  ?  ?Smoking Status: ?  reports that he has quit smoking. His smoking use included cigarettes. He has never used smokeless tobacco. ? ?  ?Allergies:  ?Allergies  ?Allergen Reactions  ? Latex Hives and Itching  ? Morphine And Related Itching  ?  welps on body  ? ? ?Medications: ?No current facility-administered medications for this encounter.  ? ?Current Outpatient Medications  ?Medication Sig Dispense Refill  ? aspirin EC 81 MG tablet Take 81 mg by mouth daily.    ? buPROPion (WELLBUTRIN XL) 150 MG 24 hr tablet Take 150 mg by mouth daily.  0  ? clonazePAM (KLONOPIN) 1 MG tablet Take 2 mg by mouth at bedtime.  0  ? cyclobenzaprine (FLEXERIL) 10 MG tablet  Take 10 mg by mouth 2 (two) times daily.  1  ? DULoxetine (CYMBALTA) 30 MG capsule Take 30 mg by mouth at bedtime.  5  ? DULoxetine (CYMBALTA) 60 MG capsule Take 60 mg by mouth every morning.  0  ? glimepiride (AMARYL) 1 MG tablet Take 1 mg by mouth daily.  0  ? levothyroxine (SYNTHROID, LEVOTHROID) 125 MCG tablet Take 125 mcg by mouth daily.  1  ? lisinopril (PRINIVIL,ZESTRIL) 10 MG tablet Take 10 mg by mouth daily.  1  ? naproxen (NAPROSYN) 500 MG tablet Take 500 mg by mouth every 12 (twelve) hours.  1  ? niacin 500 MG tablet Take 500 mg by mouth 2 (two) times daily with a meal.    ? oxyCODONE-acetaminophen (ROXICET) 5-325 MG tablet Take 1-2 tablets by mouth every 4 (four) hours as  needed for severe pain. 60 tablet 0  ? traMADol (ULTRAM) 50 MG tablet Take 100 mg by mouth every morning.   1  ? ? ?No results found for this or any previous visit (from the past 48 hour(s)). ?No results found. ? ?ROS: ?Pain with rom of the right upper extremity ? ?Physical Exam: ?Alert and oriented 76 y.o. male in no acute distress ?Cranial nerves 2-12 intact ?Cervical spine: full rom with no tenderness, nv intact distally ?Chest: active breath sounds bilaterally, no wheeze rhonchi or rales ?Heart: regular rate and rhythm, no murmur ?Abd: non tender non distended with active bowel sounds ?Hip is stable with rom  ?Right shoulder painful and weak rom ?Nv intact distally ?No rashes or edema distally ? ?Assessment/Plan ?Assessment: right shoulder cuff arthropathy ? ?Plan: ? ?Patient will undergo a right reverse total shoulder by Dr. Ranell Patrick at [facility] benefits and expectations were discussed with the patient. Patient understand risks, benefits and expectations and wishes to proceed. ?Preoperative templating of the joint replacement has been completed, documented, and submitted to the Operating Room personnel in order to optimize intra-operative equipment management.  ? ?Alphonsa Overall PA-C, MPAS ?[facility] now [facility] ?[address], [place], [place] [address] ?Phone: [phone] ?[email or web address] ?Facebook  Runner, broadcasting/film/video  ? ?  ? ?

## 2021-10-08 ENCOUNTER — Other Ambulatory Visit (HOSPITAL_COMMUNITY): Payer: Medicare Other

## 2021-10-12 NOTE — Patient Instructions (Addendum)
DUE TO COVID-19 ONLY TWO VISITORS  (aged 74 and older)  IS ALLOWED TO COME WITH YOU AND STAY IN THE WAITING ROOM ONLY DURING PRE OP AND PROCEDURE.   ?**NO VISITORS ARE ALLOWED IN THE SHORT STAY AREA OR RECOVERY ROOM!!** ? ?IF YOU WILL BE ADMITTED INTO THE HOSPITAL YOU ARE ALLOWED ONLY FOUR SUPPORT PEOPLE DURING VISITATION HOURS ONLY (7 AM -8PM)   ?The support person(s) must pass our screening, gel in and out ?Visitors GUEST BADGE MUST BE WORN VISIBLY  ?One adult visitor may remain with you overnight and MUST be in the room by 8 P.M.  ? ?You are not required to quarantine ?Hand Hygiene often ?Do NOT share personal items ?Notify your provider if you are in close contact with someone who has COVID or you develop fever 100.4 or greater, new onset of sneezing, cough, sore throat, shortness of breath or body aches. ? ?     ? Your procedure is scheduled on:  10-22-21 ? ? Report to [facility] Main Entrance ? ?  Report to admitting at 7:15 AM ? ? Call this number if you have problems the morning of surgery [phone] ? ? Do not eat food :After Midnight. ? ? After Midnight you may have the following liquids until 6:50 AM DAY OF SURGERY ? ?Water ?Black Coffee (sugar ok, NO MILK/CREAM OR CREAMERS)  ?Tea (sugar ok, NO MILK/CREAM OR CREAMERS) regular and decaf                             ?Plain Jell-O (NO RED)                                           ?Fruit ices (not with fruit pulp, NO RED)                                     ?Popsicles (NO RED)                                                                  ?Juice: apple, WHITE grape, WHITE cranberry ?Sports drinks like Gatorade (NO RED) ?Clear broth(vegetable,chicken,beef) ?       ? ? ?FOLLOW  ANY ADDITIONAL PRE OP INSTRUCTIONS YOU RECEIVED FROM YOUR SURGEON'S OFFICE!!! ?  ?  ?Oral Hygiene is also important to reduce your risk of infection.                                    ?Remember - BRUSH YOUR TEETH THE MORNING OF SURGERY WITH YOUR REGULAR TOOTHPASTE ? ? Do  NOT smoke after Midnight ? ? Take these medicines the morning of surgery with A SIP OF WATER: Bupropion, Colchicine, Duloxetine, Gabapentin, Levothyroxine ? ?DO NOT TAKE ANY ORAL DIABETIC MEDICATIONS DAY OF YOUR SURGERY ? ?Bring CPAP mask and tubing day of surgery. ?                  ?  You may not have any metal on your body including, jewelry, and body piercing ? ?           Do not wear  lotions, powders, cologne, or deodorant ? ?            Men may shave face and neck. ? ? Do not bring valuables to the hospital. [facility] IS NOT RESPONSIBLE   FOR VALUABLES. ? ? Contacts, dentures or bridgework may not be worn into surgery. ? ? Bring small overnight bag day of surgery. ?  ?Special Instructions: Bring a copy of your healthcare power of attorney and living will documents the day of surgery if you haven't scanned them before. ? ?Please read over the following fact sheets you were given: IF YOU HAVE QUESTIONS ABOUT YOUR PRE-OP INSTRUCTIONS PLEASE CALL [phone] [facility]  ? ? ?[facility]- Preparing for Total Shoulder Arthroplasty  ?  ?Before surgery, you can play an important role. Because skin is not sterile, your skin needs to be as free of germs as possible. You can reduce the number of germs on your skin by using the following products. ?Benzoyl Peroxide Gel ?Reduces the number of germs present on the skin ?Applied twice a day to shoulder area starting two days before surgery   ? ?================================================================== ? ?Please follow these instructions carefully: ? ?BENZOYL PEROXIDE 5% GEL ? ?Please do not use if you have an allergy to benzoyl peroxide.   If your skin becomes reddened/irritated stop using the benzoyl peroxide. ? ?Starting two days before surgery, apply as follows: ?Apply benzoyl peroxide in the morning and at night. Apply after taking a shower. If you are not taking a shower clean entire shoulder front, back, and side along with the armpit with a clean wet  washcloth. ? ?Place a quarter-sized dollop on your shoulder and rub in thoroughly, making sure to cover the front, back, and side of your shoulder, along with the armpit.  ? ?2 days before ____ AM   ____ PM              1 day before ____ AM   ____ PM ?                        ?Do this twice a day for two days.  (Last application is the night before surgery, AFTER using the CHG soap as described below). ? ?Do NOT apply benzoyl peroxide gel on the day of surgery.  ? ?[facility] - Preparing for Surgery ?Before surgery, you can play an important role.  Because skin is not sterile, your skin needs to be as free of germs as possible.  You can reduce the number of germs on your skin by washing with CHG (chlorahexidine gluconate) soap before surgery.  CHG is an antiseptic cleaner which kills germs and bonds with the skin to continue killing germs even after washing. ?Please DO NOT use if you have an allergy to CHG or antibacterial soaps.  If your skin becomes reddened/irritated stop using the CHG and inform your nurse when you arrive at Short Stay. ?Do not shave (including legs and underarms) for at least 48 hours prior to the first CHG shower.  You may shave your face/neck. ? ?Please follow these instructions carefully: ? 1.  Shower with CHG Soap the night before surgery and the  morning of surgery. ? 2.  If you choose to wash your hair, wash your hair first as usual with your  normal  shampoo. ? 3.  After you shampoo, rinse your hair and body thoroughly to remove the shampoo.                            ? 4.  Use CHG as you would any other liquid soap.  You can apply chg directly to the skin and wash.  Gently with a scrungie or clean washcloth. ? 5.  Apply the CHG Soap to your body ONLY FROM THE NECK DOWN.   Do   not use on face/ open      ?                     Wound or open sores. Avoid contact with eyes, ears mouth and   genitals (private parts).  ?                     Engineering geologistWash face,  Genitals (private parts) with your normal  soap. ?            6.  Wash thoroughly, paying special attention to the area where your    surgery  will be performed. ? 7.  Thoroughly rinse your body with warm water from the neck down. ? 8.  DO NOT shower/wash with your normal soap after using and rinsing off the CHG Soap. ?               9.  Pat yourself dry with a clean towel. ?           10.  Wear clean pajamas. ?           11.  Place clean sheets on your bed the night of your first shower and do not  sleep with pets. ?Day of Surgery : ?Do not apply any lotions/deodorants the morning of surgery.  Please wear clean clothes to the hospital/surgery center. ? ?FAILURE TO FOLLOW THESE INSTRUCTIONS MAY RESULT IN THE CANCELLATION OF YOUR SURGERY ? ?PATIENT SIGNATURE_________________________________ ? ?NURSE SIGNATURE__________________________________ ? ?________________________________________________________________________  ?  ? ?Incentive Spirometer ? ?An incentive spirometer is a tool that can help keep your lungs clear and active. This tool measures how well you are filling your lungs with each breath. Taking long deep breaths may help reverse or decrease the chance of developing breathing (pulmonary) problems (especially infection) following: ?A long period of time when you are unable to move or be active. ?BEFORE THE PROCEDURE  ?If the spirometer includes an indicator to show your best effort, your nurse or respiratory therapist will set it to a desired goal. ?If possible, sit up straight or lean slightly forward. Try not to slouch. ?Hold the incentive spirometer in an upright position. ?INSTRUCTIONS FOR USE  ?Sit on the edge of your bed if possible, or sit up as far as you can in bed or on a chair. ?Hold the incentive spirometer in an upright position. ?Breathe out normally. ?Place the mouthpiece in your mouth and seal your lips tightly around it. ?Breathe in slowly and as deeply as possible, raising the piston or the ball toward the top of the column. ?Hold  your breath for 3-5 seconds or for as long as possible. Allow the piston or ball to fall to the bottom of the column. ?Remove the mouthpiece from your mouth and breathe out normally. ?Rest for a few sec

## 2021-10-12 NOTE — Progress Notes (Addendum)
COVID Vaccine Completed:  Yes x2 ?Date COVID Vaccine completed: ?Has received booster:  Yes x1 ?COVID vaccine manufacturer: [employer]  ? ?Date of COVID positive in last 90 days:  No ? ?PCP - Feliciana Rossetti, MD ?Cardiologist - Ginger Carne, MD ?Pulmonologist - Marcellus Scott, MD (note requested) ? ?Chest x-ray - N/A ?EKG - 10-13-21 Epic ?Stress Test - greater than 2 years [facility] ?ECHO - greater than 2 years [facility] ?Cardiac Cath - N/A ?Pacemaker/ICD device last checked: ?Spinal Cord Stimulator: ? ?Bowel Prep - N/A ? ?Sleep Study - Yes, +sleep apnea ?CPAP -  Yes, has not used in 3 weeks because he has been sleeping in a chair due to shoulder pain ? ?Fasting Blood Sugar -  ?Checks Blood Sugar - does not check  ? ?Blood Thinner Instructions: ?Aspirin Instructions:  ASA 81.  To stop one a week before per patient ?Last Dose: ? ?Activity level:   Can go up a flight of stairs and perform activities of daily living without stopping and without symptoms of chest pain.  Patient does have shortness of breath with exertion, feels that he is at his baseline. ?   ?Anesthesia review: CIDP, HTN, DM, OSA. Cardiology eval for abnormal EKG, pulmonary fibrosis ? ?Creatinine 1.81 on PAT labs. ? ?Patient denies shortness of breath, fever, cough and chest pain at PAT appointment ? ? ?Patient verbalized understanding of instructions that were given to them at the PAT appointment. Patient was also instructed that they will need to review over the PAT instructions again at home before surgery.  ?

## 2021-10-13 ENCOUNTER — Encounter (HOSPITAL_COMMUNITY)
Admission: RE | Admit: 2021-10-13 | Discharge: 2021-10-13 | Disposition: A | Discharge status: Home / Self Care | Payer: Medicare Other | Source: Ambulatory Visit | Attending: Orthopedic Surgery | Admitting: Orthopedic Surgery

## 2021-10-13 ENCOUNTER — Encounter (HOSPITAL_COMMUNITY): Payer: Self-pay

## 2021-10-13 ENCOUNTER — Other Ambulatory Visit: Payer: Self-pay

## 2021-10-13 VITALS — BP 109/69 | HR 89 | Temp 98.6°F | Resp 18 | Ht 76.0 in | Wt 238.1 lb

## 2021-10-13 DIAGNOSIS — E119 Type 2 diabetes mellitus without complications: Secondary | ICD-10-CM | POA: Diagnosis not present

## 2021-10-13 DIAGNOSIS — I2510 Atherosclerotic heart disease of native coronary artery without angina pectoris: Secondary | ICD-10-CM | POA: Diagnosis not present

## 2021-10-13 DIAGNOSIS — D649 Anemia, unspecified: Secondary | ICD-10-CM | POA: Insufficient documentation

## 2021-10-13 DIAGNOSIS — Z01818 Encounter for other preprocedural examination: Secondary | ICD-10-CM | POA: Insufficient documentation

## 2021-10-13 HISTORY — DX: Pulmonary fibrosis, unspecified: J84.10

## 2021-10-13 HISTORY — DX: Anemia, unspecified: D64.9

## 2021-10-13 HISTORY — DX: Dyspnea, unspecified: R06.00

## 2021-10-13 LAB — SURGICAL PCR SCREEN
MRSA, PCR: NEGATIVE
Staphylococcus aureus: NEGATIVE

## 2021-10-13 LAB — BASIC METABOLIC PANEL WITH GFR
Anion gap: 9 (ref 5–15)
BUN: 28 mg/dL — ABNORMAL HIGH (ref 8–23)
CO2: 26 mmol/L (ref 22–32)
Calcium: 9.3 mg/dL (ref 8.9–10.3)
Chloride: 96 mmol/L — ABNORMAL LOW (ref 98–111)
Creatinine, Ser: 1.81 mg/dL — ABNORMAL HIGH (ref 0.61–1.24)
GFR, Estimated: 39 mL/min — ABNORMAL LOW
Glucose, Bld: 118 mg/dL — ABNORMAL HIGH (ref 70–99)
Potassium: 4.7 mmol/L (ref 3.5–5.1)
Sodium: 131 mmol/L — ABNORMAL LOW (ref 135–145)

## 2021-10-13 LAB — HEMOGLOBIN A1C
Hgb A1c MFr Bld: 6 % — ABNORMAL HIGH (ref 4.8–5.6)
Mean Plasma Glucose: 125.5 mg/dL

## 2021-10-13 LAB — CBC
HCT: 42.1 % (ref 39.0–52.0)
Hemoglobin: 14.5 g/dL (ref 13.0–17.0)
MCH: 30.8 pg (ref 26.0–34.0)
MCHC: 34.4 g/dL (ref 30.0–36.0)
MCV: 89.4 fL (ref 80.0–100.0)
Platelets: 226 10*3/uL (ref 150–400)
RBC: 4.71 MIL/uL (ref 4.22–5.81)
RDW: 13.1 % (ref 11.5–15.5)
WBC: 7.7 10*3/uL (ref 4.0–10.5)
nRBC: 0 % (ref 0.0–0.2)

## 2021-10-13 LAB — GLUCOSE, CAPILLARY: Glucose-Capillary: 126 mg/dL — ABNORMAL HIGH (ref 70–99)

## 2021-10-22 ENCOUNTER — Encounter (HOSPITAL_COMMUNITY)
Admission: RE | Disposition: A | Discharge status: Home / Self Care | Payer: Self-pay | Source: Ambulatory Visit | Attending: Orthopedic Surgery

## 2021-10-22 ENCOUNTER — Ambulatory Visit (HOSPITAL_BASED_OUTPATIENT_CLINIC_OR_DEPARTMENT_OTHER): Payer: Medicare Other | Admitting: Certified Registered"

## 2021-10-22 ENCOUNTER — Other Ambulatory Visit: Payer: Self-pay

## 2021-10-22 ENCOUNTER — Ambulatory Visit (HOSPITAL_COMMUNITY): Payer: Medicare Other | Admitting: Physician Assistant

## 2021-10-22 ENCOUNTER — Encounter (HOSPITAL_COMMUNITY): Payer: Self-pay | Admitting: Orthopedic Surgery

## 2021-10-22 ENCOUNTER — Ambulatory Visit (HOSPITAL_COMMUNITY)
Admission: RE | Admit: 2021-10-22 | Discharge: 2021-10-22 | Disposition: A | Discharge status: Home / Self Care | Payer: Medicare Other | Source: Ambulatory Visit | Attending: Orthopedic Surgery | Admitting: Orthopedic Surgery

## 2021-10-22 DIAGNOSIS — K219 Gastro-esophageal reflux disease without esophagitis: Secondary | ICD-10-CM | POA: Insufficient documentation

## 2021-10-22 DIAGNOSIS — E119 Type 2 diabetes mellitus without complications: Secondary | ICD-10-CM

## 2021-10-22 DIAGNOSIS — M19011 Primary osteoarthritis, right shoulder: Secondary | ICD-10-CM | POA: Insufficient documentation

## 2021-10-22 DIAGNOSIS — G4730 Sleep apnea, unspecified: Secondary | ICD-10-CM | POA: Insufficient documentation

## 2021-10-22 DIAGNOSIS — Z7984 Long term (current) use of oral hypoglycemic drugs: Secondary | ICD-10-CM | POA: Insufficient documentation

## 2021-10-22 DIAGNOSIS — I10 Essential (primary) hypertension: Secondary | ICD-10-CM

## 2021-10-22 DIAGNOSIS — Z87891 Personal history of nicotine dependence: Secondary | ICD-10-CM | POA: Insufficient documentation

## 2021-10-22 DIAGNOSIS — E039 Hypothyroidism, unspecified: Secondary | ICD-10-CM | POA: Insufficient documentation

## 2021-10-22 DIAGNOSIS — M75101 Unspecified rotator cuff tear or rupture of right shoulder, not specified as traumatic: Secondary | ICD-10-CM

## 2021-10-22 HISTORY — PX: REVERSE SHOULDER ARTHROPLASTY: SHX5054

## 2021-10-22 LAB — GLUCOSE, CAPILLARY
Glucose-Capillary: 153 mg/dL — ABNORMAL HIGH (ref 70–99)
Glucose-Capillary: 127 mg/dL — ABNORMAL HIGH (ref 70–99)

## 2021-10-22 SURGERY — ARTHROPLASTY, SHOULDER, TOTAL, REVERSE
Anesthesia: General | Site: Shoulder | Laterality: Right

## 2021-10-22 MED ORDER — PROMETHAZINE HCL 25 MG/ML IJ SOLN: 6.2500 mg | INTRAMUSCULAR | Status: DC | PRN

## 2021-10-22 MED ORDER — HYDROMORPHONE HCL 1 MG/ML IJ SOLN: 0.2500 mg | INTRAMUSCULAR | Status: DC | PRN

## 2021-10-22 MED ORDER — PHENYLEPHRINE HCL (PRESSORS) 10 MG/ML IV SOLN
INTRAVENOUS | Status: AC
  Filled 2021-10-22: qty 1

## 2021-10-22 MED ORDER — ONDANSETRON HCL 4 MG/2ML IJ SOLN
INTRAMUSCULAR | Status: DC | PRN
  Administered 2021-10-22: 4 mg via INTRAVENOUS

## 2021-10-22 MED ORDER — SUCCINYLCHOLINE CHLORIDE 200 MG/10ML IV SOSY
PREFILLED_SYRINGE | INTRAVENOUS | Status: DC | PRN
Start: 2021-10-22 — End: 2021-10-22
  Administered 2021-10-22: 120 mg via INTRAVENOUS

## 2021-10-22 MED ORDER — AMISULPRIDE (ANTIEMETIC) 5 MG/2ML IV SOLN: 10.0000 mg | Freq: Once | INTRAVENOUS | Status: DC | PRN

## 2021-10-22 MED ORDER — BUPIVACAINE HCL (PF) 0.5 % IJ SOLN
INTRAMUSCULAR | Status: DC | PRN
  Administered 2021-10-22: 20 mL via PERINEURAL

## 2021-10-22 MED ORDER — ORAL CARE MOUTH RINSE: 15.0000 mL | Freq: Once | OROMUCOSAL | Status: AC

## 2021-10-22 MED ORDER — LIDOCAINE HCL (CARDIAC) PF 100 MG/5ML IV SOSY
PREFILLED_SYRINGE | INTRAVENOUS | Status: DC | PRN
  Administered 2021-10-22: 40 mg via INTRAVENOUS

## 2021-10-22 MED ORDER — PHENYLEPHRINE HCL-NACL 20-0.9 MG/250ML-% IV SOLN
INTRAVENOUS | Status: DC | PRN
  Administered 2021-10-22: 100 ug/min via INTRAVENOUS

## 2021-10-22 MED ORDER — CEFAZOLIN SODIUM-DEXTROSE 2-4 GM/100ML-% IV SOLN
2.0000 g | INTRAVENOUS | Status: AC
  Administered 2021-10-22: 2 g via INTRAVENOUS
  Filled 2021-10-22: qty 100

## 2021-10-22 MED ORDER — LACTATED RINGERS IV SOLN
INTRAVENOUS | Status: DC
Start: 2021-10-22 — End: 2021-10-22

## 2021-10-22 MED ORDER — BUPIVACAINE-EPINEPHRINE (PF) 0.25% -1:200000 IJ SOLN
INTRAMUSCULAR | Status: AC
  Filled 2021-10-22: qty 30

## 2021-10-22 MED ORDER — PROPOFOL 10 MG/ML IV BOLUS
INTRAVENOUS | Status: AC
  Filled 2021-10-22: qty 20

## 2021-10-22 MED ORDER — BUPIVACAINE LIPOSOME 1.3 % IJ SUSP
INTRAMUSCULAR | Status: DC | PRN
  Administered 2021-10-22: 10 mL via PERINEURAL

## 2021-10-22 MED ORDER — FENTANYL CITRATE (PF) 100 MCG/2ML IJ SOLN
INTRAMUSCULAR | Status: AC
  Filled 2021-10-22: qty 2

## 2021-10-22 MED ORDER — MIDAZOLAM HCL 2 MG/2ML IJ SOLN
1.0000 mg | INTRAMUSCULAR | Status: DC
  Administered 2021-10-22: 1 mg via INTRAVENOUS
  Filled 2021-10-22: qty 2

## 2021-10-22 MED ORDER — OXYCODONE-ACETAMINOPHEN 5-325 MG PO TABS: 1.0000 | ORAL_TABLET | ORAL | 0 refills | Status: AC | PRN

## 2021-10-22 MED ORDER — OXYCODONE HCL 5 MG PO TABS: 5.0000 mg | ORAL_TABLET | Freq: Once | ORAL | Status: DC | PRN

## 2021-10-22 MED ORDER — SODIUM CHLORIDE 0.9 % IR SOLN
Status: DC | PRN
Start: 2021-10-22 — End: 2021-10-22
  Administered 2021-10-22: 1000 mL

## 2021-10-22 MED ORDER — BUPIVACAINE-EPINEPHRINE (PF) 0.25% -1:200000 IJ SOLN
INTRAMUSCULAR | Status: DC | PRN
  Administered 2021-10-22: 15 mL

## 2021-10-22 MED ORDER — EPHEDRINE SULFATE (PRESSORS) 50 MG/ML IJ SOLN
INTRAMUSCULAR | Status: DC | PRN
  Administered 2021-10-22: 5 mg via INTRAVENOUS
  Administered 2021-10-22: 10 mg via INTRAVENOUS
  Administered 2021-10-22: 5 mg via INTRAVENOUS
  Administered 2021-10-22: 10 mg via INTRAVENOUS
  Administered 2021-10-22: 5 mg via INTRAVENOUS
  Administered 2021-10-22: 10 mg via INTRAVENOUS
  Administered 2021-10-22: 5 mg via INTRAVENOUS

## 2021-10-22 MED ORDER — METHOCARBAMOL 500 MG PO TABS: 750.0000 mg | ORAL_TABLET | Freq: Three times a day (TID) | ORAL | 1 refills | Status: AC | PRN

## 2021-10-22 MED ORDER — PHENYLEPHRINE HCL (PRESSORS) 10 MG/ML IV SOLN
INTRAVENOUS | Status: DC | PRN
  Administered 2021-10-22 (×10): 160 ug via INTRAVENOUS

## 2021-10-22 MED ORDER — CHLORHEXIDINE GLUCONATE 0.12 % MT SOLN
15.0000 mL | Freq: Once | OROMUCOSAL | Status: AC
  Administered 2021-10-22: 15 mL via OROMUCOSAL

## 2021-10-22 MED ORDER — FENTANYL CITRATE PF 50 MCG/ML IJ SOSY
50.0000 ug | PREFILLED_SYRINGE | INTRAMUSCULAR | Status: DC
  Administered 2021-10-22: 100 ug via INTRAVENOUS
  Filled 2021-10-22: qty 2

## 2021-10-22 MED ORDER — FENTANYL CITRATE (PF) 100 MCG/2ML IJ SOLN
INTRAMUSCULAR | Status: DC | PRN
  Administered 2021-10-22 (×2): 50 ug via INTRAVENOUS

## 2021-10-22 MED ORDER — DEXAMETHASONE SODIUM PHOSPHATE 10 MG/ML IJ SOLN
INTRAMUSCULAR | Status: DC | PRN
  Administered 2021-10-22: 8 mg via INTRAVENOUS

## 2021-10-22 MED ORDER — PROPOFOL 10 MG/ML IV BOLUS
INTRAVENOUS | Status: DC | PRN
  Administered 2021-10-22: 110 mg via INTRAVENOUS

## 2021-10-22 MED ORDER — OXYCODONE HCL 5 MG/5ML PO SOLN: 5.0000 mg | Freq: Once | ORAL | Status: DC | PRN

## 2021-10-22 MED ORDER — ONDANSETRON HCL 4 MG PO TABS: 4.0000 mg | ORAL_TABLET | Freq: Three times a day (TID) | ORAL | 1 refills | Status: AC | PRN

## 2021-10-22 SURGICAL SUPPLY — 74 items
BAG COUNTER SPONGE SURGICOUNT (BAG) ×1 IMPLANT
BAG ZIPLOCK 12X15 (MISCELLANEOUS) ×1 IMPLANT
BIT DRILL 1.6MX128 (BIT) ×1 IMPLANT
BIT DRILL 170X2.5X (BIT) IMPLANT
BIT DRL 170X2.5X (BIT) ×1
BLADE SAG 18X100X1.27 (BLADE) ×2 IMPLANT
COVER BACK TABLE 60X90IN (DRAPES) ×2 IMPLANT
COVER SURGICAL LIGHT HANDLE (MISCELLANEOUS) ×2 IMPLANT
DRAPE INCISE IOBAN 66X45 STRL (DRAPES) ×2 IMPLANT
DRAPE ORTHO SPLIT 77X108 STRL (DRAPES) ×2
DRAPE SHEET LG 3/4 BI-LAMINATE (DRAPES) ×2 IMPLANT
DRAPE SURG ORHT 6 SPLT 77X108 (DRAPES) ×2 IMPLANT
DRAPE TOP 10253 STERILE (DRAPES) ×2 IMPLANT
DRAPE U-SHAPE 47X51 STRL (DRAPES) ×2 IMPLANT
DRILL 2.5 (BIT) ×1
DRSG ADAPTIC 3X8 NADH LF (GAUZE/BANDAGES/DRESSINGS) ×2 IMPLANT
DRSG PAD ABDOMINAL 8X10 ST (GAUZE/BANDAGES/DRESSINGS) ×2 IMPLANT
DURAPREP 26ML APPLICATOR (WOUND CARE) ×2 IMPLANT
ELECT BLADE TIP CTD 4 INCH (ELECTRODE) ×2 IMPLANT
ELECT NDL TIP 2.8 STRL (NEEDLE) ×1 IMPLANT
ELECT NEEDLE TIP 2.8 STRL (NEEDLE) ×2 IMPLANT
ELECT PENCIL ROCKER SW 15FT (MISCELLANEOUS) ×1 IMPLANT
ELECT REM PT RETURN 15FT ADLT (MISCELLANEOUS) ×2 IMPLANT
EPIPHYSI RIGHT SZ 2 (Shoulder) ×2 IMPLANT
EPIPHYSIS RIGHT SZ 2 (Shoulder) IMPLANT
FACESHIELD WRAPAROUND (MASK) ×2 IMPLANT
FACESHIELD WRAPAROUND OR TEAM (MASK) ×1 IMPLANT
GAUZE SPONGE 4X4 12PLY STRL (GAUZE/BANDAGES/DRESSINGS) ×2 IMPLANT
GLENOSPHERE XTEND LAT 42+0 STD (Miscellaneous) ×1 IMPLANT
GLOVE BIOGEL PI IND STRL 7.5 (GLOVE) ×1 IMPLANT
GLOVE BIOGEL PI IND STRL 8.5 (GLOVE) ×1 IMPLANT
GLOVE BIOGEL PI INDICATOR 7.5 (GLOVE) ×1
GLOVE BIOGEL PI INDICATOR 8.5 (GLOVE) ×1
GLOVE ORTHO TXT STRL SZ7.5 (GLOVE) ×2 IMPLANT
GLOVE SURG ORTHO 8.5 STRL (GLOVE) ×2 IMPLANT
GOWN STRL REUS W/ TWL XL LVL3 (GOWN DISPOSABLE) ×2 IMPLANT
GOWN STRL REUS W/TWL XL LVL3 (GOWN DISPOSABLE) ×2
KIT BASIN OR (CUSTOM PROCEDURE TRAY) ×2 IMPLANT
KIT TURNOVER KIT A (KITS) IMPLANT
MANIFOLD NEPTUNE II (INSTRUMENTS) ×2 IMPLANT
METAGLENE DELTA EXTEND (Trauma) IMPLANT
METAGLENE DXTEND (Trauma) ×2 IMPLANT
NDL MAYO CATGUT SZ4 TPR NDL (NEEDLE) ×1 IMPLANT
NEEDLE MAYO CATGUT SZ4 (NEEDLE) ×2 IMPLANT
NS IRRIG 1000ML POUR BTL (IV SOLUTION) ×2 IMPLANT
PACK SHOULDER (CUSTOM PROCEDURE TRAY) ×2 IMPLANT
PIN GUIDE 1.2 (PIN) ×1 IMPLANT
PIN GUIDE GLENOPHERE 1.5MX300M (PIN) ×1 IMPLANT
PIN METAGLENE 2.5 (PIN) ×1 IMPLANT
PROTECTOR NERVE ULNAR (MISCELLANEOUS) ×2 IMPLANT
RESTRAINT HEAD UNIVERSAL NS (MISCELLANEOUS) ×2 IMPLANT
SCREW 4.5X18MM (Screw) ×1 IMPLANT
SCREW BN 18X4.5XSTRL SHLDR (Screw) IMPLANT
SCREW LOCK DELTA XTEND 4.5X30 (Screw) ×2 IMPLANT
SLING ARM FOAM STRAP LRG (SOFTGOODS) ×1 IMPLANT
SMARTMIX MINI TOWER (MISCELLANEOUS)
SPACER 42 PLUS 6 (Orthopedic Implant) ×1 IMPLANT
SPIKE FLUID TRANSFER (MISCELLANEOUS) ×1 IMPLANT
SPONGE T-LAP 4X18 ~~LOC~~+RFID (SPONGE) ×1 IMPLANT
STEM 12 HA (Stem) ×1 IMPLANT
STRIP CLOSURE SKIN 1/2X4 (GAUZE/BANDAGES/DRESSINGS) ×2 IMPLANT
SUCTION FRAZIER HANDLE 10FR (MISCELLANEOUS) ×1
SUCTION TUBE FRAZIER 10FR DISP (MISCELLANEOUS) ×1 IMPLANT
SUT FIBERWIRE #2 38 T-5 BLUE (SUTURE) ×10
SUT MNCRL AB 4-0 PS2 18 (SUTURE) ×2 IMPLANT
SUT VIC AB 0 CT1 36 (SUTURE) ×4 IMPLANT
SUT VIC AB 0 CT2 27 (SUTURE) ×2 IMPLANT
SUT VIC AB 2-0 CT1 27 (SUTURE) ×1
SUT VIC AB 2-0 CT1 TAPERPNT 27 (SUTURE) ×1 IMPLANT
SUTURE FIBERWR #2 38 T-5 BLUE (SUTURE) ×2 IMPLANT
TAPE CLOTH SURG 6X10 WHT LF (GAUZE/BANDAGES/DRESSINGS) ×1 IMPLANT
TOWEL OR 17X26 10 PK STRL BLUE (TOWEL DISPOSABLE) ×2 IMPLANT
TOWER SMARTMIX MINI (MISCELLANEOUS) IMPLANT
WATER STERILE IRR 1000ML POUR (IV SOLUTION) ×2 IMPLANT

## 2021-10-22 NOTE — Interval H&P Note (Signed)
History and Physical Interval Note:  10/22/2021 8:55 AM  Louis Simpson  has presented today for surgery, with the diagnosis of right shoulder osteoarthritis.  The various methods of treatment have been discussed with the patient and family. After consideration of risks, benefits and other options for treatment, the patient has consented to  Procedure(s): REVERSE SHOULDER ARTHROPLASTY (Right) as a surgical intervention.  The patient's history has been reviewed, patient examined, no change in status, stable for surgery.  I have reviewed the patient's chart and labs.  Questions were answered to the patient's satisfaction.     Augustin Schooling

## 2021-10-22 NOTE — Transfer of Care (Signed)
Immediate Anesthesia Transfer of Care Note  Patient: Louis Simpson  Procedure(s) Performed: REVERSE SHOULDER ARTHROPLASTY (Right: Shoulder)  Patient Location: PACU  Anesthesia Type:General and Regional  Level of Consciousness: sedated  Airway & Oxygen Therapy: Patient Spontanous Breathing and Patient connected to face mask oxygen  Post-op Assessment: Report given to RN and Post -op Vital signs reviewed and stable  Post vital signs: Reviewed and stable  Last Vitals:  Vitals Value Taken Time  BP 80/55 10/22/21 1134  Temp    Pulse 71 10/22/21 1136  Resp 18 10/22/21 1136  SpO2 99 % 10/22/21 1136  Vitals shown include unvalidated device data.  Last Pain:  Vitals:   10/22/21 0900  TempSrc:   PainSc: 0-No pain      Patients Stated Pain Goal: 3 (10/22/21 0804)  Complications: No notable events documented.

## 2021-10-22 NOTE — Op Note (Signed)
NAME: Louis Simpson, Louis Simpson MEDICAL RECORD NO: [id number] ACCOUNT NO: [id number] DATE OF BIRTH: 03/21/1946 FACILITY: [facility] LOCATION: [facility] PHYSICIAN: Almedia Balls. 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  PROCEDURE PERFORMED:  Right reverse shoulder placement using DePuy Delta Xtend prosthesis with subscapularis repair.  ATTENDING SURGEON:  Almedia Balls. Ranell Patrick, MD  ASSISTANT:  Konrad Felix Dixon, [place], who was scrubbed during the entire procedure, and necessary for satisfactory completion of surgery.  ANESTHESIA:  General anesthesia was used plus interscalene block.  ESTIMATED BLOOD LOSS:  250 mL.  FLUID REPLACEMENT:  1500 mL crystalloid.  COUNTS:  Instrument counts were correct.  COMPLICATIONS:  There were no complications.  ANTIBIOTICS:  Perioperative antibiotics were given.  INDICATIONS:  The patient is a 76 year old male with a history of worsening shoulder pain and decreasing function secondary to end-stage arthritis, bone-on-bone.  The patient also has rotator cuff insufficiency.  Given the combination of poor cuff  function and end-stage arthritis, we discussed options and recommended reverse shoulder replacement.  The patient agreed to this plan.  Informed consent obtained.  DESCRIPTION OF PROCEDURE:  After an adequate level of general anesthesia was achieved, the patient was positioned in the modified beach chair position.  Right shoulder correctly identified and sterilely prepped and draped in the usual manner.  Timeout  called, verifying correct patient, correct site.  We entered the patient's shoulder using a standard deltopectoral approach, starting at the coracoid process extending down to the anterior humerus.  Dissection down through subcutaneous tissues using  needle  tip Bovie.  We identified the cephalic vein and took that laterally with the deltoid.  Pectoralis was taken medially and the conjoined tendon, identified and retracted medially.  We placed our deep retractors.  We then tenodesed the biceps in situ  with 0 Vicryl figure-of-eight suture x2.  We then released the subscapularis subperiosteally off the lesser tuberosity and tagged with #2 FiberWire suture in a modified Mason-Allen suture technique for repair at the end.  We then released the inferior  capsule progressively externally rotating and delivering the humeral head on the wound.  We released the remaining supraspinatus and infraspinatus, which was in poor shape with some calcification and some delamination of the tendon.  We did preserve the  posterior infraspinatus and the teres minor.  With this exposed the humeral head, which we delivered out of the wound.  This was devoid of cartilage.  We entered the proximal humerus with a 6 mm reamer, reaming up to a size 12.  We then took our 12 mm T  handle guide and placed that for resection of the head and resected the head at 20 degrees of retroversion with the oscillating saw.  We removed excess osteophytes with a rongeur.  We then subluxed the humerus posteriorly and gained good exposure of the  glenoid face.  We removed the hypertrophied capsule, the labrum and the biceps anchor.  We placed our retractors such that we had good visualization.  We placed our guide pin centered low on the glenoid face.  We then reamed for the metaglene baseplate  getting down the subchondral bone.  We did peripheral hand reaming and then drilled that at our central peg hole.  We then impacted the metaglene baseplate into position, referencing off the 6 o'clock scapular neck position.  We then took our 30 screw  inferiorly, a 30 screw at the base of the coracoid and 18 screw nonlocked posteriorly. We locked the superior and inferior screws.  We then secured a 42 standard  glenosphere to the baseplate with the screwdriver.  I did a finger sweep to make sure we had  the soft tissue free of the bearing.  Once we had that confirmed, we went back to the humeral side, reamed for the 2 right metaphysis and then placed the 12 stem and the 2 right metaphysis set on the 0 setting and impacted in 20 degrees of retroversion.   We trialed that, we were happy with our 42+3 poly trial, but we thought we could probably get a +6 for the real.  We removed the trial components.  We irrigated thoroughly, used available bone graft from the humeral head in impaction grafting technique  and impacted the HA coated size 12 stem with the 2 right metaphysis set on the 0 setting and impacted in 20 degrees of retroversion.  We had good stem security.  We trialled again with a 42+3.  We end up going with a 42+6 poly, placed on the humeral  tray and impacted and then reduced the shoulder.  We had appropriate tension and stability.  We then irrigated thoroughly.  We drilled holes in lesser tuberosity and placed #2 FiberWire suture to assist in repair of the subscap to bone.  We went up  affecting our subscap to repair the bone using suture through bone and had a really nice repair linking into the pec and the biceps.  Once we had that done, we went ahead and irrigated thoroughly and repaired deltopectoral interval with 0 Vicryl suture  followed by 2-0 Vicryl for subcutaneous closure and 4-0 Monocryl for skin.  Steri-Strips were applied followed by sterile dressing.  The patient tolerated surgery well.   PUS D: 10/22/2021 11:49:26 am T: 10/22/2021 2:29:00 pm  JOB: [id number]/ [id number]

## 2021-10-22 NOTE — Discharge Instructions (Addendum)
Ice to the shoulder constantly.  Keep the incision covered and clean and dry for one week, then ok to get it wet in the shower.  Do exercise as instructed several times per day.  DO NOT reach behind your back or push up out of a chair with the operative arm.  Use a sling while you are up and around for comfort, may remove while seated.  Keep pillow propped behind the operative elbow so that you have your arm in front of you.  You should always be able to see your elbow.   Follow up with Dr Veverly Fells in two weeks in the office, call [phone] for appt

## 2021-10-22 NOTE — Anesthesia Procedure Notes (Signed)
Procedure Name: Intubation Date/Time: 10/22/2021 9:41 AM Performed by: Jonna Munro, CRNA Pre-anesthesia Checklist: Patient identified, Emergency Drugs available, Suction available, Patient being monitored and Timeout performed Patient Re-evaluated:Patient Re-evaluated prior to induction Oxygen Delivery Method: Circle system utilized Preoxygenation: Pre-oxygenation with 100% oxygen Induction Type: IV induction Laryngoscope Size: Mac and 4 Grade View: Grade I Tube type: Oral Tube size: 7.5 mm Number of attempts: 1 Airway Equipment and Method: Stylet Placement Confirmation: ETT inserted through vocal cords under direct vision, positive ETCO2 and breath sounds checked- equal and bilateral Secured at: 23 cm Tube secured with: Tape Dental Injury: Teeth and Oropharynx as per pre-operative assessment

## 2021-10-22 NOTE — Anesthesia Preprocedure Evaluation (Signed)
Anesthesia Evaluation  Patient identified by MRN, date of birth, ID band Patient awake    Reviewed: Allergy & Precautions, NPO status , Patient's Chart, lab work & pertinent test results  Airway Mallampati: II  TM Distance: >3 FB Neck ROM: Full    Dental  (+) Teeth Intact, Dental Advisory Given   Pulmonary sleep apnea , former smoker,    breath sounds clear to auscultation       Cardiovascular hypertension, Pt. on medications  Rhythm:Regular Rate:Normal     Neuro/Psych Depression  Neuromuscular disease    GI/Hepatic GERD  ,  Endo/Other  diabetesHypothyroidism   Renal/GU      Musculoskeletal  (+) Arthritis , Osteoarthritis,    Abdominal   Peds  Hematology   Anesthesia Other Findings   Reproductive/Obstetrics                             Anesthesia Physical  Anesthesia Plan  ASA: III  Anesthesia Plan: General   Post-op Pain Management:  Regional for Post-op pain and Regional block*, Dilaudid IV and Minimal or no pain anticipated   Induction: Intravenous  PONV Risk Score and Plan: 2 and Ondansetron, Midazolam and Treatment may vary due to age or medical condition  Airway Management Planned: Oral ETT  Additional Equipment:   Intra-op Plan:   Post-operative Plan: Extubation in OR  Informed Consent: I have reviewed the patients History and Physical, chart, labs and discussed the procedure including the risks, benefits and alternatives for the proposed anesthesia with the patient or authorized representative who has indicated his/her understanding and acceptance.       Plan Discussed with:   Anesthesia Plan Comments:         Anesthesia Quick Evaluation

## 2021-10-22 NOTE — Brief Op Note (Signed)
10/22/2021  11:39 AM  PATIENT:  Louis Simpson  76 y.o. male  PRE-OPERATIVE DIAGNOSIS:  Degenerative joint disease of shoulder region, end stage and rotator cuff insufficiency  POST-OPERATIVE DIAGNOSIS:  same  PROCEDURE:  Procedure(s): REVERSE SHOULDER ARTHROPLASTY (Right) DePuy Delta Xtend with subscap repair  SURGEON:  Surgeon(s) and Role:    Beverely Low, MD - Primary  PHYSICIAN ASSISTANT:   ASSISTANTS: Thea Gist, PA-C   ANESTHESIA:   regional and general  EBL:  250 mL   BLOOD ADMINISTERED:none  DRAINS: none   LOCAL MEDICATIONS USED:  MARCAINE     SPECIMEN:  No Specimen  DISPOSITION OF SPECIMEN:  N/A  COUNTS:  YES  TOURNIQUET:  * No tourniquets in log *  DICTATION: .Other Dictation: Dictation Number [id number]  PLAN OF CARE: Discharge to home after PACU  PATIENT DISPOSITION:  PACU - hemodynamically stable.   Delay start of Pharmacological VTE agent (>24hrs) due to surgical blood loss or risk of bleeding: not applicable

## 2021-10-22 NOTE — Anesthesia Procedure Notes (Signed)
Anesthesia Regional Block: Interscalene brachial plexus block   Pre-Anesthetic Checklist: , timeout performed,  Correct Patient, Correct Site, Correct Laterality,  Correct Procedure, Correct Position, site marked,  Risks and benefits discussed,  Surgical consent,  Pre-op evaluation,  At surgeon's request and post-op pain management  Laterality: Right  Prep: chloraprep       Needles:  Injection technique: Single-shot  Needle Type: Stimiplex     Needle Length: 9cm  Needle Gauge: 21     Additional Needles:   Procedures:,,,, ultrasound used (permanent image in chart),,    Narrative:  Start time: 10/22/2021 8:59 AM End time: 10/22/2021 9:04 AM Injection made incrementally with aspirations every 5 mL.  Performed by: Personally  Anesthesiologist: Lowella Curb, MD

## 2021-10-22 NOTE — Anesthesia Postprocedure Evaluation (Signed)
Anesthesia Post Note  Patient: Louis Simpson  Procedure(s) Performed: REVERSE SHOULDER ARTHROPLASTY (Right: Shoulder)     Patient location during evaluation: PACU Anesthesia Type: General Level of consciousness: awake and alert Pain management: pain level controlled Vital Signs Assessment: post-procedure vital signs reviewed and stable Respiratory status: spontaneous breathing, nonlabored ventilation and respiratory function stable Cardiovascular status: blood pressure returned to baseline and stable Postop Assessment: no apparent nausea or vomiting Anesthetic complications: no   No notable events documented.  Last Vitals:  Vitals:   10/22/21 1255 10/22/21 1256  BP: 115/69   Pulse: 71 64  Resp:    Temp:    SpO2: 92% 90%    Last Pain:  Vitals:   10/22/21 1256  TempSrc:   PainSc: 0-No pain                 Lowella Curb

## 2021-10-22 NOTE — Progress Notes (Signed)
Appears PT has been discharged- CPAP not needed.

## 2021-10-27 ENCOUNTER — Encounter (HOSPITAL_COMMUNITY): Payer: Self-pay | Admitting: Orthopedic Surgery
